# Patient Record
Sex: Male | Born: 1950 | Race: White | Hispanic: No | Smoking: Current every day smoker
Health system: Southern US, Community
[De-identification: ages and names within clinical notes are randomized; demographics above are authoritative.]

## PROBLEM LIST (undated history)

## (undated) DIAGNOSIS — F329 Major depressive disorder, single episode, unspecified: Secondary | ICD-10-CM

## (undated) DIAGNOSIS — F1010 Alcohol abuse, uncomplicated: Secondary | ICD-10-CM

## (undated) DIAGNOSIS — F419 Anxiety disorder, unspecified: Secondary | ICD-10-CM

## (undated) DIAGNOSIS — E785 Hyperlipidemia, unspecified: Secondary | ICD-10-CM

## (undated) DIAGNOSIS — T7840XA Allergy, unspecified, initial encounter: Secondary | ICD-10-CM

## (undated) DIAGNOSIS — F32A Depression, unspecified: Secondary | ICD-10-CM

## (undated) DIAGNOSIS — I10 Essential (primary) hypertension: Secondary | ICD-10-CM

## (undated) DIAGNOSIS — K219 Gastro-esophageal reflux disease without esophagitis: Secondary | ICD-10-CM

## (undated) HISTORY — DX: Hyperlipidemia, unspecified: E78.5

## (undated) HISTORY — DX: Alcohol abuse, uncomplicated: F10.10

## (undated) HISTORY — DX: Depression, unspecified: F32.A

## (undated) HISTORY — DX: Gastro-esophageal reflux disease without esophagitis: K21.9

## (undated) HISTORY — DX: Allergy, unspecified, initial encounter: T78.40XA

## (undated) HISTORY — DX: Essential (primary) hypertension: I10

## (undated) HISTORY — DX: Anxiety disorder, unspecified: F41.9

## (undated) HISTORY — DX: Major depressive disorder, single episode, unspecified: F32.9

---

## 2001-06-12 ENCOUNTER — Emergency Department (HOSPITAL_COMMUNITY): Admission: EM | Admit: 2001-06-12 | Discharge: 2001-06-12 | Payer: Self-pay | Admitting: Emergency Medicine

## 2008-06-05 ENCOUNTER — Inpatient Hospital Stay (HOSPITAL_COMMUNITY): Admission: AD | Admit: 2008-06-05 | Discharge: 2008-06-09 | Payer: Self-pay | Admitting: Orthopedic Surgery

## 2010-04-14 LAB — BASIC METABOLIC PANEL
BUN: 11 mg/dL (ref 6–23)
CO2: 23 mEq/L (ref 19–32)
Chloride: 106 mEq/L (ref 96–112)
Creatinine, Ser: 0.91 mg/dL (ref 0.4–1.5)
Glucose, Bld: 157 mg/dL — ABNORMAL HIGH (ref 70–99)
Potassium: 3.3 mEq/L — ABNORMAL LOW (ref 3.5–5.1)

## 2010-04-14 LAB — CBC
HCT: 47.5 % (ref 39.0–52.0)
MCHC: 34.4 g/dL (ref 30.0–36.0)
MCV: 98.3 fL (ref 78.0–100.0)
Platelets: 212 10*3/uL (ref 150–400)
RDW: 12.9 % (ref 11.5–15.5)

## 2010-04-14 LAB — SEDIMENTATION RATE: Sed Rate: 18 mm/hr — ABNORMAL HIGH (ref 0–16)

## 2010-05-20 NOTE — Discharge Summary (Signed)
NAMEJULUS, KELLEY NO.:  [id number]   MEDICAL RECORD NO.:  [id number]          PATIENT TYPE:  INP   LOCATION:  5004                         FACILITY:  [facility]   PHYSICIAN:  Eulas Post, MD    DATE OF BIRTH:  1950/05/13   DATE OF ADMISSION:  06/05/2008  DATE OF DISCHARGE:  06/09/2008                               DISCHARGE SUMMARY   ADMISSION DIAGNOSIS:  Right foot cellulitis.   DISCHARGE DIAGNOSIS:  Right foot cellulitis.   DISCHARGE MEDICATIONS:  Ciprofloxacin and Keflex for a period of 2  weeks, as well as Vicodin.   HOSPITAL COURSE:  Mr. Da Authement is a 60 year old gentleman who stepped  on a nail.  He was on outpatient antibiotics in the form of doxycycline  and amoxicillin.  He was having worsening redness and swelling in foot.  He presented to our office and was admitted to the hospital for IV  antibiotics.  He was placed on vancomycin and Ciprofloxacin IV.  He made  slow and steady progress each day.  There was never a location on the  foot that appeared to have any fluid collection or pus, and given his  marked improvement over the first 24-36 hours, we continued to monitor  him.  His swelling was improving and the redness has dramatically  improved on the dorsum of his foot.  He remained afebrile throughout his  hospital stay.  His sed rate was only 18 and his white count was 13.  He  is planned to be discharged home with followup with me next Wednesday.  He benefited maximally from this hospital stay and there were no  complications.      Eulas Post, MD  Electronically Signed     JPL/MEDQ  D:  06/08/2008  T:  06/08/2008  Job:  [id number]

## 2010-05-23 NOTE — Consult Note (Signed)
[place]. [facility]  Patient:    Bruce Lara, Bruce Lara Visit Number: [id number] MRN: [id number]          Service Type: EMS Location: [facility] Attending Physician:  Hanley Seamen Dictated by:   Artist Pais Mina Marble, M.D. Proc. Date: 06/12/01 Admit Date:  06/12/2001 Discharge Date: 06/12/2001                            Consultation Report  PHYSICIAN REQUESTING CONSULTATION:  _____  REASON FOR CONSULTATION:  This is a 60 year old man who was sharpening a blade and sustained a laceration to his right thumb and hand over the base of the thenar musculature.  He was transferred here from Dr. Jule Ser office at [facility] with questionable arterial bleed.  He comes in today.  He is an otherwise healthy 60 year old male with no known drug allergies and no current medications.  RECENT HOSPITALIZATIONS/SURGERIES:  Noncontributory.  FAMILY HISTORY:  Noncontributory.  SOCIAL HISTORY:  Noncontributory.  PHYSICAL EXAMINATION:  He has a 5 cm laceration at the base of the thumb in the area of the thenar musculature.  He has a palpable radial pulse proximally and he is neurovascularly intact distally.  He has full thumb motion including flexion and extension, abduction and adduction as well as opposition.  He does have a laceration through the skin and dermis with exposed thenar muscle.  No evidence of true arterial bleeding in the hand today in the emergency department.  DISPOSITION:  He was given a 1% plain lidocaine field block.  Once adequate anesthesia was obtained, he was prepped and draped in the usual sterile field. The wound was closed with 4-0 nylon and horizontal mattress sutures x 5, placed in a sterile dressing with Xeroform and 4 x 4s and a compressive wrap. He was discharged from the emergency department on Vicodin #15 for pain, Keflex 500 mg one p.o. q.i.d. for one week for antibiotic prophylaxis.  Follow up in my office this Thursday. Dictated by:    Artist Pais Mina Marble, M.D. Attending Physician:  Hanley Seamen DD:  06/12/01 TD:  06/14/01 Job: [id number]

## 2014-06-11 ENCOUNTER — Ambulatory Visit (INDEPENDENT_AMBULATORY_CARE_PROVIDER_SITE_OTHER): Payer: 59 | Admitting: Internal Medicine

## 2014-06-11 ENCOUNTER — Encounter (INDEPENDENT_AMBULATORY_CARE_PROVIDER_SITE_OTHER): Payer: Self-pay

## 2014-06-11 ENCOUNTER — Encounter: Payer: Self-pay | Admitting: Internal Medicine

## 2014-06-11 VITALS — BP 130/82 | HR 85 | Temp 98.7°F | Wt 166.0 lb

## 2014-06-11 DIAGNOSIS — F329 Major depressive disorder, single episode, unspecified: Secondary | ICD-10-CM | POA: Diagnosis not present

## 2014-06-11 DIAGNOSIS — F32A Depression, unspecified: Secondary | ICD-10-CM | POA: Insufficient documentation

## 2014-06-11 DIAGNOSIS — F1010 Alcohol abuse, uncomplicated: Secondary | ICD-10-CM | POA: Insufficient documentation

## 2014-06-11 DIAGNOSIS — I10 Essential (primary) hypertension: Secondary | ICD-10-CM | POA: Diagnosis not present

## 2014-06-11 MED ORDER — AMLODIPINE BESYLATE 5 MG PO TABS: 5.0000 mg | ORAL_TABLET | Freq: Every day | ORAL | Status: DC

## 2014-06-11 NOTE — Progress Notes (Signed)
Pre visit review using our clinic review tool, if applicable. No additional management support is needed unless otherwise documented below in the visit note. 

## 2014-06-11 NOTE — Assessment & Plan Note (Signed)
Well controlled on current therapy  Will check CMET at next visit Will request records from previous PCP

## 2014-06-11 NOTE — Assessment & Plan Note (Signed)
Stable on Prozac and Wellbutrin.

## 2014-06-11 NOTE — Progress Notes (Addendum)
HPI  Pt presents to the clinic today to establish care an for management of the conditions listed below. He is transferring care from [facility] in [facility]. Dr. Jeralene Huff was his previous PCP.  HTN: BP well controlled on Clorthalidone and Norvasc. His Lisinopril was stopped due to elevated creatinine. His creatinine was last checked 5/17 and it 1.7.  Depression: Chronic but well controlled on Wellbutrin and Prozac. He denies SI/HI.  Alcohol abuse: He reports he only drinks about 3 beers per night. He does not feel like he has a problem.  Past Medical History  Diagnosis Date  . Depression   . Hypertension   . Alcohol abuse     Current Outpatient Prescriptions  Medication Sig Dispense Refill  . amLODipine (NORVASC) 5 MG tablet Take 5 mg by mouth daily.     Marland Kitchen aspirin 325 MG tablet Take 325 mg by mouth daily.    Marland Kitchen buPROPion (WELLBUTRIN XL) 300 MG 24 hr tablet Take 300 mg by mouth daily.     . chlorthalidone (HYGROTON) 25 MG tablet Take 25 mg by mouth daily.     Marland Kitchen FLUoxetine (PROZAC) 20 MG capsule Take 20 mg by mouth daily.      No current facility-administered medications for this visit.    No Known Allergies  Family History  Problem Relation Age of Onset  . Alcohol abuse Father   . Hypertension Father     History   Social History  . Marital Status: Married    Spouse Name: N/A  . Number of Children: N/A  . Years of Education: N/A   Occupational History  . Not on file.   Social History Main Topics  . Smoking status: Former Research scientist (life sciences)  . Smokeless tobacco: Not on file     Comment: quit 2015 smoke 30 years  . Alcohol Use: 0.0 oz/week    0 Standard drinks or equivalent per week     Comment: daily---40 oz daily  . Drug Use: Not on file  . Sexual Activity: Not on file   Other Topics Concern  . Not on file   Social History Narrative  . No narrative on file    ROS:  Constitutional: Denies fever, malaise, fatigue, headache or abrupt weight changes.  HEENT: Denies  eye pain, eye redness, ear pain, ringing in the ears, wax buildup, runny nose, nasal congestion, bloody nose, or sore throat. Respiratory: Denies difficulty breathing, shortness of breath, cough or sputum production.   Cardiovascular: Denies chest pain, chest tightness, palpitations or swelling in the hands or feet.  Gastrointestinal: Denies abdominal pain, bloating, constipation, diarrhea or blood in the stool.  GU: Denies frequency, urgency, pain with urination, blood in urine, odor or discharge. Musculoskeletal: Denies decrease in range of motion, difficulty with gait, muscle pain or joint pain and swelling.  Skin: Denies redness, rashes, lesions or ulcercations.  Neurological: Denies dizziness, difficulty with memory, difficulty with speech or problems with balance and coordination.  Psych: Pt reports chronic depression. Denies anxiety, SI/HI.  No other specific complaints in a complete review of systems (except as listed in HPI above).  PE:  BP 130/82 mmHg  Pulse 85  Temp(Src) 98.7 F (37.1 C) (Oral)  Wt 166 lb (75.297 kg)  SpO2 98% Wt Readings from Last 3 Encounters:  06/11/14 166 lb (75.297 kg)    General: Appears his stated age, well developed, well nourished in NAD. HEENT: Head: normal shape and size; Eyes: sclera white, no icterus, conjunctiva pink, PERRLA and EOMs intact;  Cardiovascular: Normal rate and rhythm. S1,S2 noted.  No murmur, rubs or gallops noted.  Pulmonary/Chest: Normal effort and positive vesicular breath sounds. No respiratory distress. No wheezes, rales or ronchi noted.  Neurological: Alert and oriented.  Psychiatric: Mood and affect normal. Behavior is normal. Judgment and thought content normal.    BMET    Component Value Date/Time   NA 136 06/05/2008 1910   K 3.3* 06/05/2008 1910   CL 106 06/05/2008 1910   CO2 23 06/05/2008 1910   GLUCOSE 157* 06/05/2008 1910   BUN 11 06/05/2008 1910   CREATININE 0.91 06/05/2008 1910   CALCIUM 8.7 06/05/2008  1910   GFRNONAA >60 06/05/2008 1910   GFRAA  06/05/2008 1910    >60        The eGFR has been calculated using the MDRD equation. This calculation has not been validated in all clinical situations. eGFR's persistently <60 mL/min signify possible Chronic Kidney Disease.    Lipid Panel  No results found for: CHOL, TRIG, HDL, CHOLHDL, VLDL, LDLCALC  CBC    Component Value Date/Time   WBC 13.3* 06/05/2008 1910   RBC 4.83 06/05/2008 1910   HGB 16.4 06/05/2008 1910   HCT 47.5 06/05/2008 1910   PLT 212 06/05/2008 1910   MCV 98.3 06/05/2008 1910   MCHC 34.4 06/05/2008 1910   RDW 12.9 06/05/2008 1910    Hgb A1C No results found for: HGBA1C   Assessment and Plan:

## 2014-06-11 NOTE — Assessment & Plan Note (Signed)
He does not feel like he has a problem Support offered today

## 2014-06-11 NOTE — Patient Instructions (Signed)

## 2014-12-11 ENCOUNTER — Encounter: Payer: Self-pay | Admitting: Internal Medicine

## 2014-12-11 ENCOUNTER — Ambulatory Visit (INDEPENDENT_AMBULATORY_CARE_PROVIDER_SITE_OTHER): Payer: 59 | Admitting: Internal Medicine

## 2014-12-11 ENCOUNTER — Other Ambulatory Visit: Payer: Self-pay | Admitting: Internal Medicine

## 2014-12-11 VITALS — BP 126/84 | HR 77 | Temp 98.6°F | Ht 65.75 in | Wt 168.0 lb

## 2014-12-11 DIAGNOSIS — I10 Essential (primary) hypertension: Secondary | ICD-10-CM

## 2014-12-11 DIAGNOSIS — Z23 Encounter for immunization: Secondary | ICD-10-CM

## 2014-12-11 DIAGNOSIS — Z125 Encounter for screening for malignant neoplasm of prostate: Secondary | ICD-10-CM | POA: Diagnosis not present

## 2014-12-11 DIAGNOSIS — F329 Major depressive disorder, single episode, unspecified: Secondary | ICD-10-CM | POA: Diagnosis not present

## 2014-12-11 DIAGNOSIS — F1010 Alcohol abuse, uncomplicated: Secondary | ICD-10-CM | POA: Diagnosis not present

## 2014-12-11 DIAGNOSIS — Z0000 Encounter for general adult medical examination without abnormal findings: Secondary | ICD-10-CM | POA: Diagnosis not present

## 2014-12-11 DIAGNOSIS — Z1211 Encounter for screening for malignant neoplasm of colon: Secondary | ICD-10-CM | POA: Diagnosis not present

## 2014-12-11 DIAGNOSIS — E785 Hyperlipidemia, unspecified: Secondary | ICD-10-CM

## 2014-12-11 DIAGNOSIS — F32A Depression, unspecified: Secondary | ICD-10-CM

## 2014-12-11 DIAGNOSIS — E876 Hypokalemia: Secondary | ICD-10-CM

## 2014-12-11 LAB — COMPREHENSIVE METABOLIC PANEL
ALBUMIN: 4.2 g/dL (ref 3.5–5.2)
ALT: 17 U/L (ref 0–53)
AST: 21 U/L (ref 0–37)
Alkaline Phosphatase: 75 U/L (ref 39–117)
BILIRUBIN TOTAL: 0.7 mg/dL (ref 0.2–1.2)
BUN: 12 mg/dL (ref 6–23)
CALCIUM: 9.4 mg/dL (ref 8.4–10.5)
CHLORIDE: 94 meq/L — AB (ref 96–112)
CO2: 31 meq/L (ref 19–32)
CREATININE: 1.16 mg/dL (ref 0.40–1.50)
GFR: 67.69 mL/min (ref >60.00)
Glucose, Bld: 97 mg/dL (ref 70–99)
Potassium: 3.2 mEq/L — ABNORMAL LOW (ref 3.5–5.1)
Sodium: 134 mEq/L — ABNORMAL LOW (ref 135–145)
Total Protein: 7.3 g/dL (ref 6.0–8.3)

## 2014-12-11 LAB — LIPID PANEL
CHOL/HDL RATIO: 6
CHOLESTEROL: 246 mg/dL — AB (ref 0–200)
HDL: 43.3 mg/dL (ref >39.00)
NONHDL: 203.02
TRIGLYCERIDES: 296 mg/dL — AB (ref 0.0–149.0)
VLDL: 59.2 mg/dL — ABNORMAL HIGH (ref 0.0–40.0)

## 2014-12-11 LAB — CBC
HCT: 50.3 % (ref 39.0–52.0)
HEMOGLOBIN: 17 g/dL (ref 13.0–17.0)
MCHC: 33.7 g/dL (ref 30.0–36.0)
MCV: 96.7 fl (ref 78.0–100.0)
PLATELETS: 326 10*3/uL (ref 150.0–400.0)
RBC: 5.2 Mil/uL (ref 4.22–5.81)
RDW: 12.9 % (ref 11.5–15.5)
WBC: 11.2 10*3/uL — ABNORMAL HIGH (ref 4.0–10.5)

## 2014-12-11 LAB — LDL CHOLESTEROL, DIRECT: Direct LDL: 155 mg/dL

## 2014-12-11 LAB — PSA: PSA: 0.45 ng/mL (ref 0.10–4.00)

## 2014-12-11 NOTE — Assessment & Plan Note (Addendum)
Well controlled on Norvasc and Chlorthalidone CMET today

## 2014-12-11 NOTE — Assessment & Plan Note (Signed)
Support offered He still does not feel like he has an issue

## 2014-12-11 NOTE — Progress Notes (Signed)
Subjective:    Patient ID: Bruce Lara, male    DOB: Apr 13, 1950, 64 y.o.   MRN: [id number]  HPI  Pt presents to the clinic today for his annual exam. He is also due for follow up of chronic conditions, see separate note.  Flu: 10/2013 Tetanus: 2015 Pneumovax: 2015 Zostovax: never, did have chicken pox Colon Screening: never Vision Screening: as needed Dentist: as needed  Diet: He does consume meat. He eats more veggies than fruits. He does consume some fried foods. He drinks mostly soda and beer. Exercise: He is not exercising.  Review of Systems      Past Medical History  Diagnosis Date  . Depression   . Hypertension   . Alcohol abuse     Current Outpatient Prescriptions  Medication Sig Dispense Refill  . amLODipine (NORVASC) 5 MG tablet Take 1 tablet (5 mg total) by mouth daily. 30 tablet 5  . aspirin 325 MG tablet Take 325 mg by mouth daily.    Marland Kitchen buPROPion (WELLBUTRIN XL) 300 MG 24 hr tablet Take 300 mg by mouth daily.     . chlorthalidone (HYGROTON) 25 MG tablet Take 25 mg by mouth daily.     Marland Kitchen FLUoxetine (PROZAC) 20 MG capsule Take 20 mg by mouth daily.      No current facility-administered medications for this visit.    No Known Allergies  Family History  Problem Relation Age of Onset  . Alcohol abuse Father   . Hypertension Father     Social History   Social History  . Marital Status: Married    Spouse Name: N/A  . Number of Children: N/A  . Years of Education: N/A   Occupational History  . Not on file.   Social History Main Topics  . Smoking status: Former Research scientist (life sciences)  . Smokeless tobacco: Not on file     Comment: quit 2015 smoke 30 years  . Alcohol Use: 1.8 oz/week    0 Standard drinks or equivalent, 3 Cans of beer per week     Comment: daily---40 oz daily  . Drug Use: No  . Sexual Activity: Not Currently   Other Topics Concern  . Not on file   Social History Narrative     Constitutional: Denies fever, malaise, fatigue, headache or  abrupt weight changes.  HEENT: Pt reports decreased hearing in right ear. Denies eye pain, eye redness, ear pain, ringing in the ears, wax buildup, runny nose, nasal congestion, bloody nose, or sore throat. Respiratory: Denies difficulty breathing, shortness of breath, cough or sputum production.   Cardiovascular: Denies chest pain, chest tightness, palpitations or swelling in the hands or feet.  Gastrointestinal: Denies abdominal pain, bloating, constipation, diarrhea or blood in the stool.  GU: Denies urgency, frequency, pain with urination, burning sensation, blood in urine, odor or discharge. Musculoskeletal: Denies decrease in range of motion, difficulty with gait, muscle pain or joint pain and swelling.  Skin: Pt reports scaly lesions on arms. Denies redness or ulcercations.  Neurological: Denies dizziness, difficulty with memory, difficulty with speech or problems with balance and coordination.  Psych: Pt reports chronic depression. Denies anxiety, SI/HI.  No other specific complaints in a complete review of systems (except as listed in HPI above).  Objective:   Physical Exam  BP 126/84 mmHg  Pulse 77  Temp(Src) 98.6 F (37 C) (Oral)  Ht 5' 5.75" (1.67 m)  Wt 168 lb (76.204 kg)  BMI 27.32 kg/m2  SpO2 98% Wt Readings from Last 3 Encounters:  12/11/14 168 lb (76.204 kg)  06/11/14 166 lb (75.297 kg)    General: Appears his stated age, well developed, well nourished in NAD. Skin: Warm, dry and intact. Scaly plaques noted on bilateral forearms. HEENT: Head: normal shape and size; Eyes: sclera injected, no icterus, conjunctiva pink, PERRLA and EOMs intact; Right Ears: Tm's gray and intact, normal light reflex; Left Ear: cerumen impaction.  Throat/Mouth: Teeth present, mucosa pink and moist, no exudate, lesions or ulcerations noted.  Neck:  Neck supple, trachea midline. No masses, lumps or thyromegaly present.  Cardiovascular: Normal rate and rhythm. S1,S2 noted.  No murmur, rubs or  gallops noted. No JVD or BLE edema. No carotid bruits noted. Pulmonary/Chest: Normal effort and positive vesicular breath sounds. No respiratory distress. No wheezes, rales or ronchi noted.  Abdomen: Soft and nontender. Normal bowel sounds. No distention or masses noted. Liver, spleen and kidneys non palpable. Musculoskeletal: Strength 5/5 BUE/BLE. No signs of joint swelling. No difficulty with gait.  Neurological: Alert and oriented. Cranial nerves II-XII grossly intact. Coordination normal.  Psychiatric: Mood and affect normal. Behavior is normal. Judgment and thought content normal.     BMET    Component Value Date/Time   NA 136 06/05/2008 1910   K 3.3* 06/05/2008 1910   CL 106 06/05/2008 1910   CO2 23 06/05/2008 1910   GLUCOSE 157* 06/05/2008 1910   BUN 11 06/05/2008 1910   CREATININE 0.91 06/05/2008 1910   CALCIUM 8.7 06/05/2008 1910   GFRNONAA >60 06/05/2008 1910   GFRAA  06/05/2008 1910    >60        The eGFR has been calculated using the MDRD equation. This calculation has not been validated in all clinical situations. eGFR's persistently <60 mL/min signify possible Chronic Kidney Disease.    Lipid Panel  No results found for: CHOL, TRIG, HDL, CHOLHDL, VLDL, LDLCALC  CBC    Component Value Date/Time   WBC 13.3* 06/05/2008 1910   RBC 4.83 06/05/2008 1910   HGB 16.4 06/05/2008 1910   HCT 47.5 06/05/2008 1910   PLT 212 06/05/2008 1910   MCV 98.3 06/05/2008 1910   MCHC 34.4 06/05/2008 1910   RDW 12.9 06/05/2008 1910    Hgb A1C No results found for: HGBA1C       Assessment & Plan:   Preventative Health Maintenance:  Flu shot today Tetanus and Pneumovax UTD He will call insurance company about shingles vaccine Encouraged him to consume a balance diet and start an exercise regimen He declines colonoscopy but is agreeable to IFOB, ordered today Encouraged him to see an eye doctor and dentist at least annually CBC, CMET, Lipid, PSA, HIV and Hep C  today  RC in 6 months, sooner if needed  HPI:  HTN: BP well controlled on Clorthalidone and Norvasc. His Lisinopril was stopped due to elevated creatinine. His creatinine was last checked 6/16 and it 0.9.  Depression: Chronic but well controlled on Wellbutrin and Prozac. He denies SI/HI.  Alcohol abuse: He reports he only drinks about 3 beers per night. He does not feel like he has a problem.  Review of Systems:   Past Medical History  Diagnosis Date  . Depression   . Hypertension   . Alcohol abuse     Current Outpatient Prescriptions  Medication Sig Dispense Refill  . amLODipine (NORVASC) 5 MG tablet Take 1 tablet (5 mg total) by mouth daily. 30 tablet 5  . aspirin 325 MG tablet Take 325 mg by mouth daily.    Marland Kitchen  buPROPion (WELLBUTRIN XL) 300 MG 24 hr tablet Take 300 mg by mouth daily.     . chlorthalidone (HYGROTON) 25 MG tablet Take 25 mg by mouth daily.     Marland Kitchen FLUoxetine (PROZAC) 20 MG capsule Take 20 mg by mouth daily.      No current facility-administered medications for this visit.    No Known Allergies  Family History  Problem Relation Age of Onset  . Alcohol abuse Father   . Hypertension Father     Social History   Social History  . Marital Status: Married    Spouse Name: N/A  . Number of Children: N/A  . Years of Education: N/A   Occupational History  . Not on file.   Social History Main Topics  . Smoking status: Former Research scientist (life sciences)  . Smokeless tobacco: Not on file     Comment: quit 2015 smoke 30 years  . Alcohol Use: 1.8 oz/week    3 Cans of beer, 0 Standard drinks or equivalent per week     Comment: daily---40 oz daily  . Drug Use: No  . Sexual Activity: Not Currently   Other Topics Concern  . Not on file   Social History Narrative     Constitutional: Denies fever, malaise, fatigue, headache or abrupt weight changes.  HEENT: Pt reports decreased hearing in right ear. Denies eye pain, eye redness, ear pain, ringing in the ears, wax buildup,  runny nose, nasal congestion, bloody nose, or sore throat. Respiratory: Denies difficulty breathing, shortness of breath, cough or sputum production.   Cardiovascular: Denies chest pain, chest tightness, palpitations or swelling in the hands or feet.  Gastrointestinal: Denies abdominal pain, bloating, constipation, diarrhea or blood in the stool.  GU: Denies urgency, frequency, pain with urination, burning sensation, blood in urine, odor or discharge. Musculoskeletal: Denies decrease in range of motion, difficulty with gait, muscle pain or joint pain and swelling.  Skin: Pt reports scaly lesions on arms. Denies redness or ulcercations.  Neurological: Denies dizziness, difficulty with memory, difficulty with speech or problems with balance and coordination.  Psych: Pt reports chronic depression. Denies anxiety, SI/HI.  No other specific complaints in a complete review of systems (except as listed in HPI above).  Objective:  BP 126/84 mmHg  Pulse 77  Temp(Src) 98.6 F (37 C) (Oral)  Ht 5' 5.75" (1.67 m)  Wt 168 lb (76.204 kg)  BMI 27.32 kg/m2  SpO2 98% General: Appears his stated age, well developed, well nourished in NAD. Skin: Warm, dry and intact. Scaly plaques noted on bilateral forearms. HEENT: Head: normal shape and size; Eyes: sclera injected, no icterus, conjunctiva pink, PERRLA and EOMs intact; Right Ears: Tm's gray and intact, normal light reflex; Left Ear: cerumen impaction.  Throat/Mouth: Teeth present, mucosa pink and moist, no exudate, lesions or ulcerations noted.  Neck:  Neck supple, trachea midline. No masses, lumps or thyromegaly present.  Cardiovascular: Normal rate and rhythm. S1,S2 noted.  No murmur, rubs or gallops noted. No JVD or BLE edema. No carotid bruits noted. Pulmonary/Chest: Normal effort and positive vesicular breath sounds. No respiratory distress. No wheezes, rales or ronchi noted.  Abdomen: Soft and nontender. Normal bowel sounds. No distention or masses  noted. Liver, spleen and kidneys non palpable. Musculoskeletal: Strength 5/5 BUE/BLE. No signs of joint swelling. No difficulty with gait.  Neurological: Alert and oriented. Cranial nerves II-XII grossly intact. Coordination normal.  Psychiatric: Mood and affect normal. Behavior is normal. Judgment and thought content normal.   Assessment and Plan:  Psoriasis:  He is not interested in treatment at this time

## 2014-12-11 NOTE — Assessment & Plan Note (Signed)
Continue Prozac and Wellbutrin CMET today

## 2014-12-11 NOTE — Patient Instructions (Signed)

## 2014-12-11 NOTE — Progress Notes (Signed)
Pre visit review using our clinic review tool, if applicable. No additional management support is needed unless otherwise documented below in the visit note. 

## 2014-12-12 LAB — HIV ANTIBODY (ROUTINE TESTING W REFLEX): HIV: NONREACTIVE

## 2014-12-12 LAB — HEPATITIS C ANTIBODY: HCV Ab: NEGATIVE

## 2014-12-14 MED ORDER — POTASSIUM CHLORIDE ER 10 MEQ PO TBCR: 10.0000 meq | EXTENDED_RELEASE_TABLET | Freq: Two times a day (BID) | ORAL | Status: DC

## 2014-12-14 MED ORDER — SIMVASTATIN 10 MG PO TABS: 10.0000 mg | ORAL_TABLET | Freq: Every day | ORAL | Status: DC

## 2014-12-14 NOTE — Addendum Note (Signed)
Addended by: Lindalou Hose Y on: 12/14/2014 05:00 PM   Modules accepted: Orders

## 2014-12-26 ENCOUNTER — Other Ambulatory Visit: Payer: Self-pay | Admitting: Internal Medicine

## 2014-12-26 DIAGNOSIS — I10 Essential (primary) hypertension: Secondary | ICD-10-CM

## 2014-12-26 MED ORDER — FLUOXETINE HCL 20 MG PO CAPS: 20.0000 mg | ORAL_CAPSULE | Freq: Every day | ORAL | Status: DC

## 2014-12-26 MED ORDER — BUPROPION HCL ER (XL) 300 MG PO TB24: 300.0000 mg | ORAL_TABLET | Freq: Every day | ORAL | Status: DC

## 2014-12-26 MED ORDER — AMLODIPINE BESYLATE 5 MG PO TABS: 5.0000 mg | ORAL_TABLET | Freq: Every day | ORAL | Status: DC

## 2014-12-26 MED ORDER — CHLORTHALIDONE 25 MG PO TABS: 25.0000 mg | ORAL_TABLET | Freq: Every day | ORAL | Status: DC

## 2014-12-26 NOTE — Progress Notes (Signed)
Pt called and wants to ck on status of multiple refills to [facility] [address] [facility]; advised pt was sent to [facility] [address]. Pt wants transferred now he is at the store. Spoke with Denman George at [employer] [employer] and pt she will get transferred now. Pt voiced understanding.

## 2015-01-02 NOTE — Addendum Note (Signed)
Addended by: Lurlean Nanny on: 01/02/2015 12:11 PM   Modules accepted: Orders

## 2015-01-15 ENCOUNTER — Other Ambulatory Visit: Payer: 59

## 2015-02-19 ENCOUNTER — Other Ambulatory Visit (INDEPENDENT_AMBULATORY_CARE_PROVIDER_SITE_OTHER): Payer: BLUE CROSS/BLUE SHIELD

## 2015-02-19 DIAGNOSIS — E876 Hypokalemia: Secondary | ICD-10-CM | POA: Diagnosis not present

## 2015-02-19 LAB — BASIC METABOLIC PANEL
BUN: 18 mg/dL (ref 6–23)
CO2: 27 mEq/L (ref 19–32)
Calcium: 9.5 mg/dL (ref 8.4–10.5)
Chloride: 102 mEq/L (ref 96–112)
Creatinine, Ser: 1.19 mg/dL (ref 0.40–1.50)
GFR: 65.68 mL/min (ref >60.00)
GLUCOSE: 117 mg/dL — AB (ref 70–99)
POTASSIUM: 3.5 meq/L (ref 3.5–5.1)
SODIUM: 138 meq/L (ref 135–145)

## 2015-02-27 ENCOUNTER — Other Ambulatory Visit: Payer: Self-pay | Admitting: Internal Medicine

## 2015-02-27 ENCOUNTER — Other Ambulatory Visit: Payer: Self-pay | Admitting: *Deleted

## 2015-03-20 ENCOUNTER — Encounter: Payer: Self-pay | Admitting: Internal Medicine

## 2015-03-20 ENCOUNTER — Ambulatory Visit (INDEPENDENT_AMBULATORY_CARE_PROVIDER_SITE_OTHER): Payer: BLUE CROSS/BLUE SHIELD | Admitting: Internal Medicine

## 2015-03-20 VITALS — BP 124/84 | HR 85 | Temp 98.4°F | Wt 164.0 lb

## 2015-03-20 DIAGNOSIS — J309 Allergic rhinitis, unspecified: Secondary | ICD-10-CM | POA: Diagnosis not present

## 2015-03-20 NOTE — Progress Notes (Signed)
Pre visit review using our clinic review tool, if applicable. No additional management support is needed unless otherwise documented below in the visit note. 

## 2015-03-20 NOTE — Patient Instructions (Signed)
Allergic Rhinitis Allergic rhinitis is when the mucous membranes in the nose respond to allergens. Allergens are particles in the air that cause your body to have an allergic reaction. This causes you to release allergic antibodies. Through a chain of events, these eventually cause you to release histamine into the blood stream. Although meant to protect the body, it is this release of histamine that causes your discomfort, such as frequent sneezing, congestion, and an itchy, runny nose.  CAUSES Seasonal allergic rhinitis (hay fever) is caused by pollen allergens that may come from grasses, trees, and weeds. Year-round allergic rhinitis (perennial allergic rhinitis) is caused by allergens such as house dust mites, pet dander, and mold spores. SYMPTOMS  Nasal stuffiness (congestion).  Itchy, runny nose with sneezing and tearing of the eyes. DIAGNOSIS Your health care provider can help you determine the allergen or allergens that trigger your symptoms. If you and your health care provider are unable to determine the allergen, skin or blood testing may be used. Your health care provider will diagnose your condition after taking your health history and performing a physical exam. Your health care provider may assess you for other related conditions, such as asthma, pink eye, or an ear infection. TREATMENT Allergic rhinitis does not have a cure, but it can be controlled by:  Medicines that block allergy symptoms. These may include allergy shots, nasal sprays, and oral antihistamines.  Avoiding the allergen. Hay fever may often be treated with antihistamines in pill or nasal spray forms. Antihistamines block the effects of histamine. There are over-the-counter medicines that may help with nasal congestion and swelling around the eyes. Check with your health care provider before taking or giving this medicine. If avoiding the allergen or the medicine prescribed do not work, there are many new medicines  your health care provider can prescribe. Stronger medicine may be used if initial measures are ineffective. Desensitizing injections can be used if medicine and avoidance does not work. Desensitization is when a patient is given ongoing shots until the body becomes less sensitive to the allergen. Make sure you follow up with your health care provider if problems continue. HOME CARE INSTRUCTIONS It is not possible to completely avoid allergens, but you can reduce your symptoms by taking steps to limit your exposure to them. It helps to know exactly what you are allergic to so that you can avoid your specific triggers. SEEK MEDICAL CARE IF:  You have a fever.  You develop a cough that does not stop easily (persistent).  You have shortness of breath.  You start wheezing.  Symptoms interfere with normal daily activities.   This information is not intended to replace advice given to you by your health care provider. Make sure you discuss any questions you have with your health care provider.   Document Released: 09/16/2000 Document Revised: 01/12/2014 Document Reviewed: 08/29/2012 Elsevier Interactive Patient Education 2016 [employer]  

## 2015-03-20 NOTE — Progress Notes (Signed)
HPI   Pt presents to the clinic today with c/o runny nose, sore throat and cough. This started 4 days ago. He is blowing clear mucous out of his nose. The cough is nonproductive. He denies fever, chills, body aches or shortness of breath. He has taken Tylenol with minimal relief. He has no history of seasonal allergies. He has not had sick contacts.  Review of Systems    Past Medical History  Diagnosis Date  . Depression   . Hypertension   . Alcohol abuse     Family History  Problem Relation Age of Onset  . Alcohol abuse Father   . Hypertension Father     Social History   Social History  . Marital Status: Married    Spouse Name: N/A  . Number of Children: N/A  . Years of Education: N/A   Occupational History  . Not on file.   Social History Main Topics  . Smoking status: Former Research scientist (life sciences)  . Smokeless tobacco: Not on file     Comment: quit 2015 smoke 30 years  . Alcohol Use: 1.8 oz/week    3 Cans of beer, 0 Standard drinks or equivalent per week     Comment: daily---40 oz daily  . Drug Use: No  . Sexual Activity: Not Currently   Other Topics Concern  . Not on file   Social History Narrative    No Known Allergies   Constitutional: Denies headache, fatigue, fever or abrupt weight changes.  HEENT:  Positive runny nose and sore throat. Denies eye redness, ear pain, ringing in the ears, wax buildup, nasal congestion. Respiratory: Positive cough. Denies difficulty breathing or shortness of breath.  Cardiovascular: Denies chest pain, chest tightness, palpitations or swelling in the hands or feet.   No other specific complaints in a complete review of systems (except as listed in HPI above).  Objective:  BP 124/84 mmHg  Pulse 85  Temp(Src) 98.4 F (36.9 C) (Oral)  Wt 164 lb (74.39 kg)  SpO2 98%   General: Appears his stated age,well developed, well nourished in NAD. HEENT: Head: normal shape and size, no sinus tenderness noted; Eyes: sclera white, no icterus,  conjunctiva pink; Ears: Tm's gray and intact, normal light reflex; Nose: mucosa boggy and moist, septum midline; Throat/Mouth: + PND. Teeth present, mucosa erythematous and moist, no exudate noted, no lesions or ulcerations noted.  Neck:  No adenopathy noted.  Cardiovascular: Normal rate and rhythm. S1,S2 noted.  No murmur, rubs or gallops noted.  Pulmonary/Chest: Normal effort and positive vesicular breath sounds. No respiratory distress. No wheezes, rales or ronchi noted.      Assessment & Plan:   Acute bacterial sinusitis  Can use a Neti Pot which can be purchased from your local drug store. Flonase 2 sprays each nostril for 3 days and then as needed. Start Zyrtec daily OTC 80 mg Depo IM today  RTC as needed or if symptoms persist.

## 2015-04-01 ENCOUNTER — Other Ambulatory Visit: Payer: Self-pay | Admitting: Internal Medicine

## 2015-04-04 ENCOUNTER — Other Ambulatory Visit: Payer: Self-pay | Admitting: *Deleted

## 2015-04-04 DIAGNOSIS — E876 Hypokalemia: Secondary | ICD-10-CM

## 2015-04-04 NOTE — Telephone Encounter (Signed)
Prescribed potassium in February.  Not sure if he was to continue taking this or if repeat labs are needed.  Please advise.

## 2015-04-05 NOTE — Telephone Encounter (Signed)
Can you call pt to verify if he is taking this daily?

## 2015-04-09 MED ORDER — POTASSIUM CHLORIDE ER 10 MEQ PO TBCR: 10.0000 meq | EXTENDED_RELEASE_TABLET | Freq: Two times a day (BID) | ORAL | Status: DC

## 2015-04-09 NOTE — Telephone Encounter (Signed)
Medication sent electronically 

## 2015-04-09 NOTE — Telephone Encounter (Signed)
Patient called and said he's out of his medication.  He takes the Potassium twice a day.  Patient can be reached at  [phone].

## 2015-04-11 NOTE — Telephone Encounter (Signed)
Pt called to ck on refill simvastatin; spoke with Inez Catalina at [employer] and pt did not pick up rx and was put back on shelf; Inez Catalina will get rx ready for pick up; pt will ck with pharmacy later today.

## 2015-05-01 ENCOUNTER — Ambulatory Visit: Payer: BLUE CROSS/BLUE SHIELD | Admitting: Family Medicine

## 2015-05-02 ENCOUNTER — Ambulatory Visit (INDEPENDENT_AMBULATORY_CARE_PROVIDER_SITE_OTHER): Payer: BLUE CROSS/BLUE SHIELD | Admitting: Internal Medicine

## 2015-05-02 ENCOUNTER — Encounter: Payer: Self-pay | Admitting: Internal Medicine

## 2015-05-02 VITALS — BP 122/76 | HR 93 | Temp 98.6°F | Wt 162.0 lb

## 2015-05-02 DIAGNOSIS — J309 Allergic rhinitis, unspecified: Secondary | ICD-10-CM | POA: Diagnosis not present

## 2015-05-02 DIAGNOSIS — J329 Chronic sinusitis, unspecified: Secondary | ICD-10-CM | POA: Diagnosis not present

## 2015-05-02 DIAGNOSIS — B349 Viral infection, unspecified: Secondary | ICD-10-CM | POA: Diagnosis not present

## 2015-05-02 DIAGNOSIS — B9789 Other viral agents as the cause of diseases classified elsewhere: Secondary | ICD-10-CM

## 2015-05-02 MED ORDER — METHYLPREDNISOLONE ACETATE 80 MG/ML IJ SUSP
80.0000 mg | Freq: Once | INTRAMUSCULAR | Status: AC
  Administered 2015-05-02: 80 mg via INTRAMUSCULAR

## 2015-05-02 MED ORDER — HYDROCODONE-HOMATROPINE 5-1.5 MG/5ML PO SYRP: 5.0000 mL | ORAL_SOLUTION | Freq: Three times a day (TID) | ORAL | Status: DC | PRN

## 2015-05-02 NOTE — Addendum Note (Signed)
Addended by: Lurlean Nanny on: 05/02/2015 02:16 PM   Modules accepted: Orders

## 2015-05-02 NOTE — Progress Notes (Signed)
Pre visit review using our clinic review tool, if applicable. No additional management support is needed unless otherwise documented below in the visit note. 

## 2015-05-02 NOTE — Patient Instructions (Signed)

## 2015-05-02 NOTE — Progress Notes (Signed)
HPI  Pt presents to the clinic today with c/o headache, sinus pressure, nasal congestion, sore throat, and cough. This started 1 week ago.  He is not blowing anything out of his nose. His cough is now productive with unknown color mucous. The cough is worse at night. He denies fever, chills or body aches. He has taken Allegra and Nasonex with minimal relief. He has not had sick contacts.   Review of Systems    Past Medical History  Diagnosis Date  . Depression   . Hypertension   . Alcohol abuse     Family History  Problem Relation Age of Onset  . Alcohol abuse Father   . Hypertension Father     Social History   Social History  . Marital Status: Married    Spouse Name: N/A  . Number of Children: N/A  . Years of Education: N/A   Occupational History  . Not on file.   Social History Main Topics  . Smoking status: Former Research scientist (life sciences)  . Smokeless tobacco: Not on file     Comment: quit 2015 smoke 30 years  . Alcohol Use: 1.8 oz/week    3 Cans of beer, 0 Standard drinks or equivalent per week     Comment: daily---40 oz daily  . Drug Use: No  . Sexual Activity: Not Currently   Other Topics Concern  . Not on file   Social History Narrative    No Known Allergies   Constitutional: Positive headache. Denies fatigue, fever, and abrupt weight changes.  HEENT:  Positive eye pain, facial pain, nasal congestion and sore throat. Denies eye redness, ear pain, ringing in the ears, wax buildup, runny nose or bloody nose. Respiratory: Positive cough. Denies difficulty breathing or shortness of breath.  Cardiovascular: Denies chest pain, chest tightness, palpitations or swelling in the hands or feet.   No other specific complaints in a complete review of systems (except as listed in HPI above).  Objective:  BP 122/76 mmHg  Pulse 93  Temp(Src) 98.6 F (37 C) (Oral)  Wt 162 lb (73.483 kg)  SpO2 98%   General: Appears his stated age,  in NAD. HEENT: Head: normal shape and size, no  sinus tenderness noted; Eyes: sclera white, no icterus, conjunctiva pink; Ears: Tm's gray and intact, normal light reflex; Nose: mucosa boggy and moist, erythematous and inflamed, septum midline; Throat/Mouth: + PND, tonsilliths present. Teeth present, mucosa erythematous and moist, no exudate noted, no lesions or ulcerations noted.  Neck:  No adenopathy noted.  Cardiovascular: Normal rate and rhythm. S1,S2 noted.  No murmur, rubs or gallops noted.  Pulmonary/Chest: Normal effort and positive vesicular breath sounds. No respiratory distress. No wheezes, rales or ronchi noted.      Assessment & Plan:   Acute viral sinusitis/Allergic Rhinitis:  Continue Allegra and Nasonex RX for Hycodan cough syrup for nighttime cough 80 mg Depo IM today  RTC as needed or if symptoms persist.

## 2015-06-11 ENCOUNTER — Ambulatory Visit (INDEPENDENT_AMBULATORY_CARE_PROVIDER_SITE_OTHER): Payer: BLUE CROSS/BLUE SHIELD | Admitting: Internal Medicine

## 2015-06-11 ENCOUNTER — Encounter: Payer: Self-pay | Admitting: Internal Medicine

## 2015-06-11 VITALS — BP 120/84 | HR 84 | Temp 99.0°F | Wt 164.0 lb

## 2015-06-11 DIAGNOSIS — E785 Hyperlipidemia, unspecified: Secondary | ICD-10-CM | POA: Diagnosis not present

## 2015-06-11 DIAGNOSIS — F329 Major depressive disorder, single episode, unspecified: Secondary | ICD-10-CM

## 2015-06-11 DIAGNOSIS — F32A Depression, unspecified: Secondary | ICD-10-CM

## 2015-06-11 DIAGNOSIS — I10 Essential (primary) hypertension: Secondary | ICD-10-CM | POA: Diagnosis not present

## 2015-06-11 DIAGNOSIS — F1010 Alcohol abuse, uncomplicated: Secondary | ICD-10-CM

## 2015-06-11 LAB — LIPID PANEL
CHOLESTEROL: 226 mg/dL — AB (ref 0–200)
HDL: 57.3 mg/dL (ref >39.00)
Total CHOL/HDL Ratio: 4
Triglycerides: 447 mg/dL — ABNORMAL HIGH (ref 0.0–149.0)

## 2015-06-11 LAB — COMPREHENSIVE METABOLIC PANEL
ALBUMIN: 4.1 g/dL (ref 3.5–5.2)
ALK PHOS: 68 U/L (ref 39–117)
ALT: 19 U/L (ref 0–53)
AST: 21 U/L (ref 0–37)
BILIRUBIN TOTAL: 0.4 mg/dL (ref 0.2–1.2)
BUN: 11 mg/dL (ref 6–23)
CO2: 29 mEq/L (ref 19–32)
Calcium: 9.4 mg/dL (ref 8.4–10.5)
Chloride: 101 mEq/L (ref 96–112)
Creatinine, Ser: 1.16 mg/dL (ref 0.40–1.50)
GFR: 67.58 mL/min (ref >60.00)
GLUCOSE: 86 mg/dL (ref 70–99)
Potassium: 3.3 mEq/L — ABNORMAL LOW (ref 3.5–5.1)
SODIUM: 140 meq/L (ref 135–145)
TOTAL PROTEIN: 7.1 g/dL (ref 6.0–8.3)

## 2015-06-11 LAB — CBC
HCT: 42.3 % (ref 39.0–52.0)
HEMOGLOBIN: 14.6 g/dL (ref 13.0–17.0)
MCHC: 34.5 g/dL (ref 30.0–36.0)
MCV: 95.9 fl (ref 78.0–100.0)
Platelets: 396 10*3/uL (ref 150.0–400.0)
RBC: 4.41 Mil/uL (ref 4.22–5.81)
RDW: 13.3 % (ref 11.5–15.5)
WBC: 14.6 10*3/uL — AB (ref 4.0–10.5)

## 2015-06-11 LAB — LDL CHOLESTEROL, DIRECT: Direct LDL: 122 mg/dL

## 2015-06-11 NOTE — Progress Notes (Signed)
HPI:  Pt presents to the clinic today for follow up of chronic conditions.  HTN: BP well controlled on Clorthalidone and Norvasc. His Lisinopril was stopped due to elevated creatinine. His creatinine was last checked 02/2015 and it 1.19. His blood pressure is 120/84 today. There is no ECG on file.  Depression: Chronic but well controlled on Wellbutrin and Prozac. He denies SI/HI.  Alcohol abuse: He reports he only drinks about 3 beers per night. He does not feel like he has a problem.  HLD: His last LDL was 155. He was started on Zocor at his last visit. He denies myalgias. He does try to consume a low fat diet. He is taking a baby ASA daily.  Review of Systems:   Past Medical History  Diagnosis Date  . Depression   . Hypertension   . Alcohol abuse     Current Outpatient Prescriptions  Medication Sig Dispense Refill  . amLODipine (NORVASC) 5 MG tablet Take 1 tablet (5 mg total) by mouth daily. 30 tablet 5  . aspirin 325 MG tablet Take 325 mg by mouth daily.    Marland Kitchen buPROPion (WELLBUTRIN XL) 300 MG 24 hr tablet Take 300 mg by mouth daily.     . chlorthalidone (HYGROTON) 25 MG tablet Take 25 mg by mouth daily.     Marland Kitchen FLUoxetine (PROZAC) 20 MG capsule Take 20 mg by mouth daily.      No current facility-administered medications for this visit.    No Known Allergies  Family History  Problem Relation Age of Onset  . Alcohol abuse Father   . Hypertension Father     Social History   Social History  . Marital Status: Married    Spouse Name: N/A  . Number of Children: N/A  . Years of Education: N/A   Occupational History  . Not on file.   Social History Main Topics  . Smoking status: Former Research scientist (life sciences)  . Smokeless tobacco: Not on file     Comment: quit 2015 smoke 30 years  . Alcohol Use: 1.8 oz/week    3 Cans of beer, 0 Standard drinks or equivalent per week     Comment: daily---40 oz daily  . Drug Use: No  . Sexual Activity: Not Currently   Other Topics Concern  . Not on  file   Social History Narrative     Constitutional: Denies fever, malaise, fatigue, headache or abrupt weight changes.  Respiratory: Denies difficulty breathing, shortness of breath, cough or sputum production.   Cardiovascular: Denies chest pain, chest tightness, palpitations or swelling in the hands or feet.  Gastrointestinal: Denies abdominal pain, bloating, constipation, diarrhea or blood in the stool.  Musculoskeletal: Denies decrease in range of motion, difficulty with gait, muscle pain or joint pain and swelling.  Skin: Pt reports scaly lesions on arms. Denies redness or ulcercations.  Neurological: Denies dizziness, difficulty with memory, difficulty with speech or problems with balance and coordination.  Psych: Pt reports chronic depression. Denies anxiety, SI/HI.  No other specific complaints in a complete review of systems (except as listed in HPI above).  Objective:  BP 120/84 mmHg  Pulse 84  Temp(Src) 99 F (37.2 C) (Oral)  Wt 164 lb (74.39 kg)  SpO2 98%  General: Appears his stated age, in NAD. Cardiovascular: Normal rate and rhythm. S1,S2 noted.  No murmur, rubs or gallops noted. No JVD or BLE edema. No carotid bruits noted. Pulmonary/Chest: Normal effort and positive vesicular breath sounds. No respiratory distress. No wheezes, rales or  ronchi noted.  Neurological: Alert and oriented.  Psychiatric: Mood and affect flat Behavior is normal. Judgment and thought content normal.   Assessment and Plan:

## 2015-06-11 NOTE — Assessment & Plan Note (Signed)
Will check Lipid Profile and CMET today Will adjust Zocor if needed Continue ASA

## 2015-06-11 NOTE — Addendum Note (Signed)
Addended by: Marchia Bond on: 06/11/2015 04:25 PM   Modules accepted: Miquel Dunn

## 2015-06-11 NOTE — Progress Notes (Signed)
Pre visit review using our clinic review tool, if applicable. No additional management support is needed unless otherwise documented below in the visit note. 

## 2015-06-11 NOTE — Assessment & Plan Note (Signed)
Chronic but stable on Prozac and Wellbutrin He thinks the Prozac is causing erectile dysfunction but he does not want to stop medication

## 2015-06-11 NOTE — Patient Instructions (Signed)

## 2015-06-11 NOTE — Assessment & Plan Note (Addendum)
Will check CBC and CMET today Continue Norvasc and Chlorthalidone ECG today normal

## 2015-06-11 NOTE — Assessment & Plan Note (Signed)
He denies having an issue with this Will check CMET today

## 2015-06-12 MED ORDER — SIMVASTATIN 20 MG PO TABS: 20.0000 mg | ORAL_TABLET | Freq: Every day | ORAL | Status: DC

## 2015-06-12 NOTE — Addendum Note (Signed)
Addended by: Lurlean Nanny on: 06/12/2015 04:28 PM   Modules accepted: Orders, Medications, SmartSet

## 2015-07-18 ENCOUNTER — Other Ambulatory Visit: Payer: Self-pay

## 2015-07-18 DIAGNOSIS — E876 Hypokalemia: Secondary | ICD-10-CM

## 2015-07-18 MED ORDER — AMLODIPINE BESYLATE 5 MG PO TABS: 5.0000 mg | ORAL_TABLET | Freq: Every day | ORAL | Status: DC

## 2015-07-18 MED ORDER — BUPROPION HCL ER (XL) 300 MG PO TB24: 300.0000 mg | ORAL_TABLET | Freq: Every day | ORAL | Status: DC

## 2015-07-18 MED ORDER — FLUOXETINE HCL 20 MG PO CAPS: 20.0000 mg | ORAL_CAPSULE | Freq: Every day | ORAL | Status: DC

## 2015-07-18 MED ORDER — CHLORTHALIDONE 25 MG PO TABS: 25.0000 mg | ORAL_TABLET | Freq: Every day | ORAL | Status: DC

## 2015-07-18 MED ORDER — POTASSIUM CHLORIDE ER 10 MEQ PO TBCR
10.0000 meq | EXTENDED_RELEASE_TABLET | Freq: Two times a day (BID) | ORAL | Status: DC
Start: 2015-07-18 — End: 2016-09-17

## 2015-12-12 ENCOUNTER — Encounter: Payer: Self-pay | Admitting: Internal Medicine

## 2015-12-12 ENCOUNTER — Ambulatory Visit (INDEPENDENT_AMBULATORY_CARE_PROVIDER_SITE_OTHER): Payer: BLUE CROSS/BLUE SHIELD | Admitting: Internal Medicine

## 2015-12-12 VITALS — BP 130/84 | HR 89 | Temp 98.9°F | Ht 66.0 in | Wt 161.5 lb

## 2015-12-12 DIAGNOSIS — Z0001 Encounter for general adult medical examination with abnormal findings: Secondary | ICD-10-CM

## 2015-12-12 DIAGNOSIS — Z23 Encounter for immunization: Secondary | ICD-10-CM | POA: Diagnosis not present

## 2015-12-12 DIAGNOSIS — H6122 Impacted cerumen, left ear: Secondary | ICD-10-CM

## 2015-12-12 DIAGNOSIS — I10 Essential (primary) hypertension: Secondary | ICD-10-CM | POA: Diagnosis not present

## 2015-12-12 MED ORDER — AMLODIPINE BESYLATE 10 MG PO TABS: 10.0000 mg | ORAL_TABLET | Freq: Every day | ORAL | 3 refills | Status: DC

## 2015-12-12 NOTE — Patient Instructions (Signed)

## 2015-12-12 NOTE — Progress Notes (Signed)
Subjective:    Patient ID: Bruce Lara, male    DOB: 1950-10-27, 65 y.o.   MRN: [id number]  HPI  Pt presents to the clinic today for his annual exam.  Flu: 12/2014 Tetanus: 02/2013 Pneumovax: 02/2013  Zostovax: never PSA screening: 12/2014 Colon Screening: never Vision Screening: never Dentist: as needed  Diet: He does eat meat. He does eat fruits and veggies a few days a week. He does eat some fried food. He drinks mostly soda or beer throughout the day. Exercise: None  Review of Systems      Past Medical History:  Diagnosis Date  . Alcohol abuse   . Depression   . Hypertension     Current Outpatient Prescriptions  Medication Sig Dispense Refill  . amLODipine (NORVASC) 5 MG tablet Take 1 tablet (5 mg total) by mouth daily. 90 tablet 1  . aspirin 325 MG tablet Take 325 mg by mouth daily.    Marland Kitchen buPROPion (WELLBUTRIN XL) 300 MG 24 hr tablet Take 1 tablet (300 mg total) by mouth daily. 30 tablet 5  . chlorthalidone (HYGROTON) 25 MG tablet Take 1 tablet (25 mg total) by mouth daily. 30 tablet 5  . FLUoxetine (PROZAC) 20 MG capsule Take 1 capsule (20 mg total) by mouth daily. 30 capsule 5  . Omega-3 Fatty Acids (FISH OIL) 1000 MG CAPS Take 1 capsule by mouth 2 (two) times daily.    . potassium chloride (K-DUR) 10 MEQ tablet Take 1 tablet (10 mEq total) by mouth 2 (two) times daily. 60 tablet 5  . simvastatin (ZOCOR) 20 MG tablet Take 1 tablet (20 mg total) by mouth at bedtime. 90 tablet 1   No current facility-administered medications for this visit.     No Known Allergies  Family History  Problem Relation Age of Onset  . Alcohol abuse Father   . Hypertension Father     Social History   Social History  . Marital status: Married    Spouse name: N/A  . Number of children: N/A  . Years of education: N/A   Occupational History  . Not on file.   Social History Main Topics  . Smoking status: Former Research scientist (life sciences)  . Smokeless tobacco: Not on file     Comment: quit  2015 smoke 30 years  . Alcohol use 1.8 oz/week    3 Cans of beer per week     Comment: daily---40 oz daily  . Drug use: No  . Sexual activity: Not Currently   Other Topics Concern  . Not on file   Social History Narrative  . No narrative on file     Constitutional: Denies fever, malaise, fatigue, headache or abrupt weight changes.  HEENT: Denies eye pain, eye redness, ear pain, ringing in the ears, wax buildup, runny nose, nasal congestion, bloody nose, or sore throat. Respiratory: Denies difficulty breathing, shortness of breath, cough or sputum production.   Cardiovascular: Denies chest pain, chest tightness, palpitations or swelling in the hands or feet.  Gastrointestinal: Denies abdominal pain, bloating, constipation, diarrhea or blood in the stool.  GU: Denies urgency, frequency, pain with urination, burning sensation, blood in urine, odor or discharge. Musculoskeletal: Denies decrease in range of motion, difficulty with gait, muscle pain or joint pain and swelling.  Skin: Denies redness, rashes, lesions or ulcercations.  Neurological: Denies dizziness, difficulty with memory, difficulty with speech or problems with balance and coordination.  Psych: Pt has history of depression. Denies anxiety, SI/HI.  No other specific complaints in  a complete review of systems (except as listed in HPI above).  Objective:   Physical Exam   BP 130/84   Pulse 89   Temp 98.9 F (37.2 C) (Oral)   Ht _0  (1.676 m)   Wt 161 lb 8 oz (73.3 kg)   SpO2 98%   BMI 26.07 kg/m  Wt Readings from Last 3 Encounters:  12/12/15 161 lb 8 oz (73.3 kg)  06/11/15 164 lb (74.4 kg)  05/02/15 162 lb (73.5 kg)    General: Appears his stated age, well developed, well nourished in NAD. Skin: Warm, dry and intact.  HEENT: Head: normal shape and size; Eyes: sclera injected, no icterus, conjunctiva pink, PERRLA and EOMs intact; Right Ear: Tm's gray and intact, normal light reflex; Left Ear: cerumen  impaction; Throat/Mouth: Teeth present, mucosa pink and moist, no exudate, lesions or ulcerations noted.  Neck:  Neck supple, trachea midline. No masses, lumps or thyromegaly present.  Cardiovascular: Normal rate and rhythm. S1,S2 noted.  No murmur, rubs or gallops noted. No JVD or BLE edema. No carotid bruits noted. Pulmonary/Chest: Normal effort and positive vesicular breath sounds. No respiratory distress. No wheezes, rales or ronchi noted.  Abdomen: Soft and nontender. Normal bowel sounds. No distention or masses noted. Liver, spleen and kidneys non palpable. Musculoskeletal: Normal range of motion. Strength 5/5 BUE/BLE. No difficulty with gait.  Neurological: Alert and oriented. Cranial nerves II-XII grossly intact. Coordination normal.  Psychiatric: Mood and affect normal. Behavior is normal. Judgment and thought content normal.     BMET    Component Value Date/Time   NA 140 06/11/2015 1449   K 3.3 (L) 06/11/2015 1449   CL 101 06/11/2015 1449   CO2 29 06/11/2015 1449   GLUCOSE 86 06/11/2015 1449   BUN 11 06/11/2015 1449   CREATININE 1.16 06/11/2015 1449   CALCIUM 9.4 06/11/2015 1449   GFRNONAA >60 06/05/2008 1910   GFRAA  06/05/2008 1910    >60        The eGFR has been calculated using the MDRD equation. This calculation has not been validated in all clinical situations. eGFR's persistently <60 mL/min signify possible Chronic Kidney Disease.    Lipid Panel     Component Value Date/Time   CHOL 226 (H) 06/11/2015 1449   TRIG (H) 06/11/2015 1449    447.0 Triglyceride is over 400; calculations on Lipids are invalid.   HDL 57.30 06/11/2015 1449   CHOLHDL 4 06/11/2015 1449   VLDL 59.2 (H) 12/11/2014 1452    CBC    Component Value Date/Time   WBC 14.6 (H) 06/11/2015 1449   RBC 4.41 06/11/2015 1449   HGB 14.6 06/11/2015 1449   HCT 42.3 06/11/2015 1449   PLT 396.0 06/11/2015 1449   MCV 95.9 06/11/2015 1449   MCHC 34.5 06/11/2015 1449   RDW 13.3 06/11/2015 1449     Hgb A1C No results found for: HGBA1C         Assessment & Plan:   Preventative Health Maintenance:  Flu shot today Tetanus and pneumovax UTD He declines shingles vaccine PSA screening with labs today He declines colonoscopy but is agreeable to Cologuard-ordered Encouraged him to consume a balanced diet and exercise regimen Advised him to see an eye doctor and dentist annually Will check CBC, CMET, lipid and A1C He declines HIV and Hep C screening today  Left ear impacted cerumen:  Attempted manual lavage by CMA-unsuccessful Cerumen moved with cerumen spoon by this provider Advised him to use Debrox OTC 2  x week to prevent wax buildup  RTC in 1 year for annual exam/follow up Webb Silversmith, NP

## 2015-12-13 LAB — COMPREHENSIVE METABOLIC PANEL
ALK PHOS: 66 U/L (ref 39–117)
ALT: 36 U/L (ref 0–53)
AST: 41 U/L — AB (ref 0–37)
Albumin: 4.6 g/dL (ref 3.5–5.2)
BILIRUBIN TOTAL: 0.5 mg/dL (ref 0.2–1.2)
BUN: 13 mg/dL (ref 6–23)
CO2: 32 meq/L (ref 19–32)
CREATININE: 1.42 mg/dL (ref 0.40–1.50)
Calcium: 11.2 mg/dL — ABNORMAL HIGH (ref 8.4–10.5)
Chloride: 92 mEq/L — ABNORMAL LOW (ref 96–112)
GFR: 53.43 mL/min — ABNORMAL LOW (ref >60.00)
GLUCOSE: 100 mg/dL — AB (ref 70–99)
Potassium: 3.5 mEq/L (ref 3.5–5.1)
Sodium: 135 mEq/L (ref 135–145)
TOTAL PROTEIN: 8 g/dL (ref 6.0–8.3)

## 2015-12-13 LAB — HEMOGLOBIN A1C: HEMOGLOBIN A1C: 5.3 % (ref 4.6–6.5)

## 2015-12-13 LAB — LIPID PANEL
CHOL/HDL RATIO: 3
Cholesterol: 192 mg/dL (ref 0–200)
HDL: 63.9 mg/dL (ref >39.00)
LDL Cholesterol: 97 mg/dL (ref 0–99)
NONHDL: 127.74
Triglycerides: 154 mg/dL — ABNORMAL HIGH (ref 0.0–149.0)
VLDL: 30.8 mg/dL (ref 0.0–40.0)

## 2015-12-13 LAB — CBC
HCT: 46.8 % (ref 39.0–52.0)
Hemoglobin: 16.3 g/dL (ref 13.0–17.0)
MCHC: 34.7 g/dL (ref 30.0–36.0)
MCV: 93.8 fl (ref 78.0–100.0)
PLATELETS: 412 10*3/uL — AB (ref 150.0–400.0)
RBC: 4.99 Mil/uL (ref 4.22–5.81)
RDW: 13.1 % (ref 11.5–15.5)
WBC: 15.6 10*3/uL — AB (ref 4.0–10.5)

## 2015-12-13 NOTE — Assessment & Plan Note (Signed)
Increase Norvasc to 10 mg for tighter blood pressure control, eRx sent to pharmacy

## 2016-01-04 ENCOUNTER — Other Ambulatory Visit: Payer: Self-pay | Admitting: Internal Medicine

## 2016-01-13 ENCOUNTER — Encounter: Payer: Self-pay | Admitting: Internal Medicine

## 2016-01-13 ENCOUNTER — Ambulatory Visit (INDEPENDENT_AMBULATORY_CARE_PROVIDER_SITE_OTHER): Payer: BLUE CROSS/BLUE SHIELD | Admitting: Internal Medicine

## 2016-01-13 VITALS — BP 120/78 | HR 90 | Temp 98.1°F | Wt 165.0 lb

## 2016-01-13 DIAGNOSIS — H1132 Conjunctival hemorrhage, left eye: Secondary | ICD-10-CM | POA: Diagnosis not present

## 2016-01-13 NOTE — Progress Notes (Signed)
Subjective:    Patient ID: Bruce Lara, male    DOB: Jul 16, 1950, 66 y.o.   MRN: [id number]  HPI  Pt presents to the clinic today with c/o left eye swelling. He noticed this 3 days ago. He feels like he has something in his eye. He denies itching or discharge. He denies runny nose, sore throat or cough. He has not had any trauma to the eye that he is aware of. He has not tried anything OTC for this.  Review of Systems      Past Medical History:  Diagnosis Date  . Alcohol abuse   . Depression   . Hypertension     Current Outpatient Prescriptions  Medication Sig Dispense Refill  . amLODipine (NORVASC) 10 MG tablet Take 1 tablet (10 mg total) by mouth daily. 90 tablet 3  . aspirin 325 MG tablet Take 325 mg by mouth daily.    Marland Kitchen buPROPion (WELLBUTRIN XL) 300 MG 24 hr tablet Take 1 tablet (300 mg total) by mouth daily. 30 tablet 5  . chlorthalidone (HYGROTON) 25 MG tablet Take 1 tablet (25 mg total) by mouth daily. 30 tablet 5  . FLUoxetine (PROZAC) 20 MG capsule Take 1 capsule (20 mg total) by mouth daily. 30 capsule 5  . Omega-3 Fatty Acids (FISH OIL) 1000 MG CAPS Take 1 capsule by mouth 2 (two) times daily.    . potassium chloride (K-DUR) 10 MEQ tablet Take 1 tablet (10 mEq total) by mouth 2 (two) times daily. 60 tablet 5  . simvastatin (ZOCOR) 20 MG tablet TAKE ONE TABLET BY MOUTH ONCE DAILY AT BEDTIME 90 tablet 3   No current facility-administered medications for this visit.     No Known Allergies  Family History  Problem Relation Age of Onset  . Alcohol abuse Father   . Hypertension Father     Social History   Social History  . Marital status: Married    Spouse name: N/A  . Number of children: N/A  . Years of education: N/A   Occupational History  . Not on file.   Social History Main Topics  . Smoking status: Former Research scientist (life sciences)  . Smokeless tobacco: Never Used     Comment: quit 2015 smoke 30 years  . Alcohol use 1.8 oz/week    3 Cans of beer per week   Comment: daily---40 oz daily  . Drug use: No  . Sexual activity: Not Currently   Other Topics Concern  . Not on file   Social History Narrative  . No narrative on file     Constitutional: Denies fever, malaise, fatigue, headache or abrupt weight changes.  HEENT: Pt reports left eye redness. Denies eye pain,  ear pain, ringing in the ears, wax buildup, runny nose, nasal congestion, bloody nose, or sore throat.  No other specific complaints in a complete review of systems (except as listed in HPI above).  Objective:   Physical Exam BP 120/78   Pulse 90   Temp 98.1 F (36.7 C) (Oral)   Wt 165 lb (74.8 kg)   SpO2 97%   BMI 26.63 kg/m  Wt Readings from Last 3 Encounters:  01/13/16 165 lb (74.8 kg)  12/12/15 161 lb 8 oz (73.3 kg)  06/11/15 164 lb (74.4 kg)    General: Appears his stated age, well developed, well nourished in NAD. HEENT: Head: normal shape and size; Left Eye: sclera with hemorrhage noted, conjunctiva pink, PERRLA and EOMs intact; BMET    Component Value Date/Time  NA 135 12/12/2015 1547   K 3.5 12/12/2015 1547   CL 92 (L) 12/12/2015 1547   CO2 32 12/12/2015 1547   GLUCOSE 100 (H) 12/12/2015 1547   BUN 13 12/12/2015 1547   CREATININE 1.42 12/12/2015 1547   CALCIUM 11.2 (H) 12/12/2015 1547   GFRNONAA >60 06/05/2008 1910   GFRAA  06/05/2008 1910    >60        The eGFR has been calculated using the MDRD equation. This calculation has not been validated in all clinical situations. eGFR's persistently <60 mL/min signify possible Chronic Kidney Disease.    Lipid Panel     Component Value Date/Time   CHOL 192 12/12/2015 1547   TRIG 154.0 (H) 12/12/2015 1547   HDL 63.90 12/12/2015 1547   CHOLHDL 3 12/12/2015 1547   VLDL 30.8 12/12/2015 1547   LDLCALC 97 12/12/2015 1547    CBC    Component Value Date/Time   WBC 15.6 (H) 12/12/2015 1547   RBC 4.99 12/12/2015 1547   HGB 16.3 12/12/2015 1547   HCT 46.8 12/12/2015 1547   PLT 412.0 (H)  12/12/2015 1547   MCV 93.8 12/12/2015 1547   MCHC 34.7 12/12/2015 1547   RDW 13.1 12/12/2015 1547    Hgb A1C Lab Results  Component Value Date   HGBA1C 5.3 12/12/2015        Assessment & Plan:   Subconjunctival Hemorrhage:  Advised him to hold ASA for 2 days Otherwise reassured him that this should resolve on its own Return precautions discussed  Webb Silversmith, NP

## 2016-01-13 NOTE — Patient Instructions (Signed)

## 2016-01-29 ENCOUNTER — Other Ambulatory Visit: Payer: Self-pay | Admitting: Internal Medicine

## 2016-03-19 ENCOUNTER — Telehealth: Payer: Self-pay | Admitting: *Deleted

## 2016-03-19 NOTE — Telephone Encounter (Signed)
Form placed in your box.

## 2016-03-19 NOTE — Telephone Encounter (Signed)
Done, placed in MYD box 

## 2016-03-19 NOTE — Telephone Encounter (Signed)
Altha Harm from [facility] office called regarding a form that was faxed. It appears the form was sent off to [employer]. I have reprinted it and placed it in the Rx tower. Please fax it to the [facility] Department at [phone] when complete.

## 2016-03-24 NOTE — Telephone Encounter (Signed)
Form has been faxed as requested.

## 2016-03-25 ENCOUNTER — Telehealth: Payer: Self-pay

## 2016-03-25 NOTE — Telephone Encounter (Signed)
Pt has scheduled an appt to see Rollene Fare in reference to his denial of gun permit. [place] cannot do anything to reverse the Sherriff's dept decision--- alcohol use and him being treated for depression is in his medical records. They requested those records, there is nothing Rollene Fare can do in reference to that documentation.... Left message on voicemail for pt to return my call as there really is no reason for OV nothing can be altered (documentation) in medical records

## 2016-03-26 ENCOUNTER — Ambulatory Visit: Payer: BLUE CROSS/BLUE SHIELD | Admitting: Internal Medicine

## 2016-04-06 DIAGNOSIS — Z7689 Persons encountering health services in other specified circumstances: Secondary | ICD-10-CM

## 2016-04-12 ENCOUNTER — Other Ambulatory Visit: Payer: Self-pay | Admitting: Internal Medicine

## 2016-06-11 ENCOUNTER — Ambulatory Visit: Payer: BLUE CROSS/BLUE SHIELD | Admitting: Internal Medicine

## 2016-06-15 ENCOUNTER — Ambulatory Visit: Payer: BLUE CROSS/BLUE SHIELD | Admitting: Internal Medicine

## 2016-09-17 ENCOUNTER — Other Ambulatory Visit: Payer: Self-pay | Admitting: *Deleted

## 2016-09-17 DIAGNOSIS — E876 Hypokalemia: Secondary | ICD-10-CM

## 2016-09-17 MED ORDER — POTASSIUM CHLORIDE ER 10 MEQ PO TBCR: 10.0000 meq | EXTENDED_RELEASE_TABLET | Freq: Two times a day (BID) | ORAL | 2 refills | Status: DC

## 2017-01-01 ENCOUNTER — Encounter: Payer: Self-pay | Admitting: Internal Medicine

## 2017-01-01 ENCOUNTER — Ambulatory Visit (INDEPENDENT_AMBULATORY_CARE_PROVIDER_SITE_OTHER): Payer: BLUE CROSS/BLUE SHIELD | Admitting: Internal Medicine

## 2017-01-01 VITALS — BP 122/80 | HR 89 | Temp 98.3°F | Ht 66.0 in | Wt 178.0 lb

## 2017-01-01 DIAGNOSIS — Z0000 Encounter for general adult medical examination without abnormal findings: Secondary | ICD-10-CM | POA: Diagnosis not present

## 2017-01-01 DIAGNOSIS — F339 Major depressive disorder, recurrent, unspecified: Secondary | ICD-10-CM | POA: Diagnosis not present

## 2017-01-01 DIAGNOSIS — Z125 Encounter for screening for malignant neoplasm of prostate: Secondary | ICD-10-CM | POA: Diagnosis not present

## 2017-01-01 DIAGNOSIS — Z1211 Encounter for screening for malignant neoplasm of colon: Secondary | ICD-10-CM | POA: Diagnosis not present

## 2017-01-01 DIAGNOSIS — I10 Essential (primary) hypertension: Secondary | ICD-10-CM | POA: Diagnosis not present

## 2017-01-01 DIAGNOSIS — E7800 Pure hypercholesterolemia, unspecified: Secondary | ICD-10-CM

## 2017-01-01 MED ORDER — FLUOXETINE HCL 20 MG PO CAPS: 20.0000 mg | ORAL_CAPSULE | Freq: Every day | ORAL | 3 refills | Status: DC

## 2017-01-01 NOTE — Assessment & Plan Note (Signed)
Controlled on Amlodipine, Chlorthalidone and Potassium supplement CBC and CMET today Reinforced DASH diet and exercise for weight loss

## 2017-01-01 NOTE — Progress Notes (Signed)
Subjective:    Patient ID: Bruce Lara, male    DOB: 1950/06/30, 66 y.o.   MRN: [id number]  HPI  Pt presents to the clinic today for his annual exam. He is also due to follow up chronic conditions.  HTN: His BP today is 122/80. He is taking Amlodipine, Chlorthalidone and a potassium supplement as prescribed. ECG from 06/2015 reviewed.  HLD: His last LDL was 97, triglycerides 154, 12/2015. He is taking Simvastatin and Fish Oil as prescribed. He does not consume a low fat diet.  Depression: Chronic but stable on Fluoxetine and Wellbutrin. He denies anxiety, SI/HI.  Flu: 12/2015 Tetanus: 03/2013 Pneumovax: 02/2013 Zostovax: never Shingrix: never PSA Screening: 12/2014 Colon Screening: never Vision Screening: as needed Dentist: as needed  Diet: He does eat meat. He consumes some fruits and veggies. He does eat fried foods. He drinks mostly soda. Exercise: None  Review of Systems  Past Medical History:  Diagnosis Date  . Alcohol abuse   . Depression   . Hypertension     Current Outpatient Medications  Medication Sig Dispense Refill  . amLODipine (NORVASC) 10 MG tablet Take 1 tablet (10 mg total) by mouth daily. 90 tablet 3  . aspirin 325 MG tablet Take 325 mg by mouth daily.    Marland Kitchen buPROPion (WELLBUTRIN XL) 300 MG 24 hr tablet TAKE ONE TABLET BY MOUTH  DAILY 90 tablet 3  . chlorthalidone (HYGROTON) 25 MG tablet TAKE ONE TABLET BY MOUTH  DAILY 90 tablet 3  . FLUoxetine (PROZAC) 20 MG capsule TAKE ONE CAPSULE BY MOUTH  DAILY 90 capsule 1  . Omega-3 Fatty Acids (FISH OIL) 1000 MG CAPS Take 1 capsule by mouth 2 (two) times daily.    . potassium chloride (K-DUR) 10 MEQ tablet Take 1 tablet (10 mEq total) by mouth 2 (two) times daily. 60 tablet 2  . simvastatin (ZOCOR) 20 MG tablet TAKE ONE TABLET BY MOUTH ONCE DAILY AT BEDTIME 90 tablet 3   No current facility-administered medications for this visit.     No Known Allergies  Family History  Problem Relation Age of Onset  .  Alcohol abuse Father   . Hypertension Father     Social History   Socioeconomic History  . Marital status: Married    Spouse name: Not on file  . Number of children: Not on file  . Years of education: Not on file  . Highest education level: Not on file  Social Needs  . Financial resource strain: Not on file  . Food insecurity - worry: Not on file  . Food insecurity - inability: Not on file  . Transportation needs - medical: Not on file  . Transportation needs - non-medical: Not on file  Occupational History  . Not on file  Tobacco Use  . Smoking status: Former Research scientist (life sciences)  . Smokeless tobacco: Never Used  . Tobacco comment: quit 2015 smoke 30 years  Substance and Sexual Activity  . Alcohol use: Yes    Alcohol/week: 1.8 oz    Types: 3 Cans of beer per week    Comment: daily---40 oz daily  . Drug use: No  . Sexual activity: Not Currently  Other Topics Concern  . Not on file  Social History Narrative  . Not on file     Constitutional: Denies fever, malaise, fatigue, headache or abrupt weight changes.  HEENT: Denies eye pain, eye redness, ear pain, ringing in the ears, wax buildup, runny nose, nasal congestion, bloody nose, or sore throat.  Respiratory: Denies difficulty breathing, shortness of breath, cough or sputum production.   Cardiovascular: Denies chest pain, chest tightness, palpitations or swelling in the hands or feet.  Gastrointestinal: Pt reports intermittent reflux. Denies abdominal pain, bloating, constipation, diarrhea or blood in the stool.  GU: Denies urgency, frequency, pain with urination, burning sensation, blood in urine, odor or discharge. Musculoskeletal: Denies decrease in range of motion, difficulty with gait, muscle pain or joint pain and swelling.  Skin: Denies redness, rashes, lesions or ulcercations.  Neurological: Denies dizziness, difficulty with memory, difficulty with speech or problems with balance and coordination.  Psych: Pt has a history of  depression. Denies anxiety, SI/HI.  No other specific complaints in a complete review of systems (except as listed in HPI above).     Objective:   Physical Exam   BP 122/80   Pulse 89   Temp 98.3 F (36.8 C) (Oral)   Ht '5\' 6"'$  (1.676 m)   Wt 178 lb (80.7 kg)   SpO2 98%   BMI 28.73 kg/m  Wt Readings from Last 3 Encounters:  01/01/17 178 lb (80.7 kg)  01/13/16 165 lb (74.8 kg)  12/12/15 161 lb 8 oz (73.3 kg)    General: Appears his stated age, obese in NAD. Skin: Warm, dry and intact.  HEENT: Head: normal shape and size; Eyes: sclera injected no icterus, conjunctiva pink, PERRLA and EOMs intact; Ears: Tm's gray and intact, normal light reflex;  Throat/Mouth: Teeth present, mucosa pink and moist, no exudate, lesions or ulcerations noted.  Neck:  Neck supple, trachea midline. No masses, lumps or thyromegaly present.  Cardiovascular: Normal rate and rhythm. S1,S2 noted.  No murmur, rubs or gallops noted. No JVD or BLE edema. No carotid bruits noted. Pulmonary/Chest: Normal effort and positive vesicular breath sounds. No respiratory distress. No wheezes, rales or ronchi noted.  Abdomen: Soft and nontender. Normal bowel sounds. No distention or masses noted. Liver, spleen and kidneys non palpable. Musculoskeletal: Strength 5/5 BUE/BLE. No difficulty with gait.  Neurological: Alert and oriented. Cranial nerves II-XII grossly intact. Coordination normal.  Psychiatric: Mood and affect normal. He does seem impaired today (history of alcoholism). Judgment and thought content normal.     BMET    Component Value Date/Time   NA 135 12/12/2015 1547   K 3.5 12/12/2015 1547   CL 92 (L) 12/12/2015 1547   CO2 32 12/12/2015 1547   GLUCOSE 100 (H) 12/12/2015 1547   BUN 13 12/12/2015 1547   CREATININE 1.42 12/12/2015 1547   CALCIUM 11.2 (H) 12/12/2015 1547   GFRNONAA >60 06/05/2008 1910   GFRAA  06/05/2008 1910    >60        The eGFR has been calculated using the MDRD equation. This  calculation has not been validated in all clinical situations. eGFR's persistently <60 mL/min signify possible Chronic Kidney Disease.    Lipid Panel     Component Value Date/Time   CHOL 192 12/12/2015 1547   TRIG 154.0 (H) 12/12/2015 1547   HDL 63.90 12/12/2015 1547   CHOLHDL 3 12/12/2015 1547   VLDL 30.8 12/12/2015 1547   LDLCALC 97 12/12/2015 1547    CBC    Component Value Date/Time   WBC 15.6 (H) 12/12/2015 1547   RBC 4.99 12/12/2015 1547   HGB 16.3 12/12/2015 1547   HCT 46.8 12/12/2015 1547   PLT 412.0 (H) 12/12/2015 1547   MCV 93.8 12/12/2015 1547   MCHC 34.7 12/12/2015 1547   RDW 13.1 12/12/2015 1547    Hgb  A1C Lab Results  Component Value Date   HGBA1C 5.3 12/12/2015          Assessment & Plan:   Preventative Health Maintenance:  Flu shot today Tetanus and pneumovax UTD He will call his insurance company to check coverage for Shingrix Referral placed to GI for screening colonoscopy Encouraged him to consume a balanced diet and exercise regimen Advised him to see an eye doctor and dentist annually Will check CBC, CMET, Lipid, A1C and PSA today  RTC in 1 year, sooner if needed Webb Silversmith, NP

## 2017-01-01 NOTE — Assessment & Plan Note (Signed)
Chronic but stable on Prozac and Wellbutrin Prozac refilled today Support offered

## 2017-01-01 NOTE — Assessment & Plan Note (Signed)
Encouraged him to consume a low fat diet CMET, Lipid and A1C today Continue Simvastatin and Fish Oil for now

## 2017-01-01 NOTE — Patient Instructions (Signed)

## 2017-01-02 LAB — COMPREHENSIVE METABOLIC PANEL
AG RATIO: 1.5 (calc) (ref 1.0–2.5)
ALBUMIN MSPROF: 3.9 g/dL (ref 3.6–5.1)
ALT: 13 U/L (ref 9–46)
AST: 23 U/L (ref 10–35)
Alkaline phosphatase (APISO): 63 U/L (ref 40–115)
BILIRUBIN TOTAL: 0.4 mg/dL (ref 0.2–1.2)
BUN / CREAT RATIO: 6 (calc) (ref 6–22)
BUN: 9 mg/dL (ref 7–25)
CO2: 28 mmol/L (ref 20–32)
Calcium: 9.4 mg/dL (ref 8.6–10.3)
Chloride: 102 mmol/L (ref 98–110)
Creat: 1.52 mg/dL — ABNORMAL HIGH (ref 0.70–1.25)
GLOBULIN: 2.6 g/dL (ref 1.9–3.7)
GLUCOSE: 96 mg/dL (ref 65–99)
POTASSIUM: 4.1 mmol/L (ref 3.5–5.3)
SODIUM: 141 mmol/L (ref 135–146)
TOTAL PROTEIN: 6.5 g/dL (ref 6.1–8.1)

## 2017-01-02 LAB — CBC
HEMATOCRIT: 43.2 % (ref 38.5–50.0)
HEMOGLOBIN: 15.4 g/dL (ref 13.2–17.1)
MCH: 33.3 pg — ABNORMAL HIGH (ref 27.0–33.0)
MCHC: 35.6 g/dL (ref 32.0–36.0)
MCV: 93.3 fL (ref 80.0–100.0)
MPV: 9.2 fL (ref 7.5–12.5)
Platelets: 401 10*3/uL — ABNORMAL HIGH (ref 140–400)
RBC: 4.63 10*6/uL (ref 4.20–5.80)
RDW: 12.6 % (ref 11.0–15.0)
WBC: 11.1 10*3/uL — AB (ref 3.8–10.8)

## 2017-01-02 LAB — HEMOGLOBIN A1C
HEMOGLOBIN A1C: 5.4 %{Hb} (ref <5.7)
Mean Plasma Glucose: 108 (calc)
eAG (mmol/L): 6 (calc)

## 2017-01-02 LAB — LIPID PANEL
Cholesterol: 240 mg/dL — ABNORMAL HIGH (ref <200)
HDL: 55 mg/dL (ref >40)
LDL Cholesterol (Calc): 141 mg/dL (calc) — ABNORMAL HIGH
NON-HDL CHOLESTEROL (CALC): 185 mg/dL — AB (ref <130)
Total CHOL/HDL Ratio: 4.4 (calc) (ref <5.0)
Triglycerides: 287 mg/dL — ABNORMAL HIGH (ref <150)

## 2017-01-02 LAB — PSA: PSA: 0.7 ng/mL (ref <=4.0)

## 2017-01-11 ENCOUNTER — Encounter: Payer: Self-pay | Admitting: Internal Medicine

## 2017-01-11 MED ORDER — SIMVASTATIN 40 MG PO TABS: 40.0000 mg | ORAL_TABLET | Freq: Every day | ORAL | 0 refills | Status: DC

## 2017-01-11 NOTE — Addendum Note (Signed)
Addended by: Modena Nunnery on: 01/11/2017 02:02 PM   Modules accepted: Orders

## 2017-01-25 ENCOUNTER — Other Ambulatory Visit (INDEPENDENT_AMBULATORY_CARE_PROVIDER_SITE_OTHER): Payer: BLUE CROSS/BLUE SHIELD

## 2017-01-25 DIAGNOSIS — E7800 Pure hypercholesterolemia, unspecified: Secondary | ICD-10-CM | POA: Diagnosis not present

## 2017-01-25 LAB — LIPID PANEL
CHOL/HDL RATIO: 3
Cholesterol: 166 mg/dL (ref 0–200)
HDL: 58 mg/dL (ref >39.00)
LDL Cholesterol: 77 mg/dL (ref 0–99)
NONHDL: 107.78
Triglycerides: 156 mg/dL — ABNORMAL HIGH (ref 0.0–149.0)
VLDL: 31.2 mg/dL (ref 0.0–40.0)

## 2017-01-25 LAB — COMPREHENSIVE METABOLIC PANEL
ALT: 34 U/L (ref 0–53)
AST: 42 U/L — AB (ref 0–37)
Albumin: 4.4 g/dL (ref 3.5–5.2)
Alkaline Phosphatase: 63 U/L (ref 39–117)
BUN: 11 mg/dL (ref 6–23)
CO2: 33 meq/L — AB (ref 19–32)
CREATININE: 1.37 mg/dL (ref 0.40–1.50)
Calcium: 9.9 mg/dL (ref 8.4–10.5)
Chloride: 92 mEq/L — ABNORMAL LOW (ref 96–112)
GFR: 55.49 mL/min — AB (ref >60.00)
GLUCOSE: 108 mg/dL — AB (ref 70–99)
Potassium: 3.2 mEq/L — ABNORMAL LOW (ref 3.5–5.1)
SODIUM: 135 meq/L (ref 135–145)
Total Bilirubin: 0.7 mg/dL (ref 0.2–1.2)
Total Protein: 7.6 g/dL (ref 6.0–8.3)

## 2017-02-12 ENCOUNTER — Other Ambulatory Visit: Payer: Self-pay | Admitting: Internal Medicine

## 2017-02-12 DIAGNOSIS — E876 Hypokalemia: Secondary | ICD-10-CM

## 2017-02-18 ENCOUNTER — Ambulatory Visit: Payer: BLUE CROSS/BLUE SHIELD | Admitting: Internal Medicine

## 2017-02-18 VITALS — BP 120/74 | HR 80 | Temp 98.2°F | Wt 170.0 lb

## 2017-02-18 DIAGNOSIS — I10 Essential (primary) hypertension: Secondary | ICD-10-CM | POA: Diagnosis not present

## 2017-02-18 DIAGNOSIS — E876 Hypokalemia: Secondary | ICD-10-CM | POA: Diagnosis not present

## 2017-02-18 LAB — BASIC METABOLIC PANEL
BUN: 10 mg/dL (ref 6–23)
CO2: 34 mEq/L — ABNORMAL HIGH (ref 19–32)
Calcium: 10 mg/dL (ref 8.4–10.5)
Chloride: 94 mEq/L — ABNORMAL LOW (ref 96–112)
Creatinine, Ser: 1.4 mg/dL (ref 0.40–1.50)
GFR: 54.1 mL/min — AB (ref >60.00)
GLUCOSE: 119 mg/dL — AB (ref 70–99)
POTASSIUM: 3.3 meq/L — AB (ref 3.5–5.1)
Sodium: 134 mEq/L — ABNORMAL LOW (ref 135–145)

## 2017-02-23 ENCOUNTER — Ambulatory Visit (AMBULATORY_SURGERY_CENTER): Payer: Self-pay

## 2017-02-23 ENCOUNTER — Other Ambulatory Visit: Payer: Self-pay

## 2017-02-23 ENCOUNTER — Encounter: Payer: Self-pay | Admitting: Internal Medicine

## 2017-02-23 VITALS — Ht 66.0 in | Wt 171.0 lb

## 2017-02-23 DIAGNOSIS — Z1211 Encounter for screening for malignant neoplasm of colon: Secondary | ICD-10-CM

## 2017-02-23 MED ORDER — NA SULFATE-K SULFATE-MG SULF 17.5-3.13-1.6 GM/177ML PO SOLN: 1.0000 | Freq: Once | ORAL | 0 refills | Status: AC

## 2017-02-23 NOTE — Progress Notes (Signed)
Subjective:    Patient ID: Bruce Lara, male    DOB: Dec 26, 1950, 67 y.o.   MRN: [id number]  HPI  Pt presents to the clinic today for 3 week follow up of HTN. At his last visit, after his labs came back, he was advised to stop his Chlorthalidone and Potassium supplement, due to decreased GFR and hypokalemia. He was advised to continue his Amlodipine. He reports he does not recall any of the conversation, and has continued to take all 3 meds. He denies dizziness, chest pain, shortness of breath or muscle cramping. His BP today is 120/74.  Review of Systems      Past Medical History:  Diagnosis Date  . Alcohol abuse   . Depression   . Hypertension     Current Outpatient Medications  Medication Sig Dispense Refill  . amLODipine (NORVASC) 10 MG tablet Take 1 tablet (10 mg total) by mouth daily. 90 tablet 3  . aspirin 325 MG tablet Take 325 mg by mouth daily.    Marland Kitchen buPROPion (WELLBUTRIN XL) 300 MG 24 hr tablet TAKE ONE TABLET BY MOUTH  DAILY 90 tablet 3  . chlorthalidone (HYGROTON) 25 MG tablet TAKE ONE TABLET BY MOUTH  DAILY 90 tablet 3  . FLUoxetine (PROZAC) 20 MG capsule Take 1 capsule (20 mg total) by mouth daily. 90 capsule 3  . Omega-3 Fatty Acids (FISH OIL) 1000 MG CAPS Take 1 capsule by mouth 4 (four) times daily.     . potassium chloride (K-DUR) 10 MEQ tablet Take 1 tablet (10 mEq total) by mouth 2 (two) times daily. 60 tablet 2  . simvastatin (ZOCOR) 40 MG tablet Take 1 tablet (40 mg total) by mouth at bedtime. 90 tablet 0   No current facility-administered medications for this visit.     No Known Allergies  Family History  Problem Relation Age of Onset  . Alcohol abuse Father   . Hypertension Father     Social History   Socioeconomic History  . Marital status: Married    Spouse name: Not on file  . Number of children: Not on file  . Years of education: Not on file  . Highest education level: Not on file  Social Needs  . Financial resource strain: Not on file    . Food insecurity - worry: Not on file  . Food insecurity - inability: Not on file  . Transportation needs - medical: Not on file  . Transportation needs - non-medical: Not on file  Occupational History  . Not on file  Tobacco Use  . Smoking status: Former Research scientist (life sciences)  . Smokeless tobacco: Never Used  . Tobacco comment: quit 2015 smoke 30 years  Substance and Sexual Activity  . Alcohol use: Yes    Alcohol/week: 1.8 oz    Types: 3 Cans of beer per week    Comment: daily---40 oz daily  . Drug use: No  . Sexual activity: Not Currently  Other Topics Concern  . Not on file  Social History Narrative  . Not on file     Constitutional: Denies fever, malaise, fatigue, headache or abrupt weight changes.  Respiratory: Denies difficulty breathing, shortness of breath, cough or sputum production.   Cardiovascular: Denies chest pain, chest tightness, palpitations or swelling in the hands or feet.  Neurological: Denies dizziness, difficulty with memory, difficulty with speech or problems with balance and coordination.    No other specific complaints in a complete review of systems (except as listed in HPI above).  Objective:   Physical Exam   BP 120/74   Pulse 80   Temp 98.2 F (36.8 C) (Oral)   Wt 170 lb (77.1 kg)   SpO2 98%   BMI 27.44 kg/m  Wt Readings from Last 3 Encounters:  02/18/17 170 lb (77.1 kg)  01/01/17 178 lb (80.7 kg)  01/13/16 165 lb (74.8 kg)    General: Appears his stated age, inebriated, in NAD. HEENT:Eyes: sclera injected, PERRLA and EOMs intact;  Cardiovascular: Normal rate and rhythm. S1,S2 noted.  No murmur, rubs or gallops noted. No JVD or BLE edema.  Pulmonary/Chest: Normal effort and positive vesicular breath sounds. No respiratory distress. No wheezes, rales or ronchi noted.  Neurological: Alert and oriented.    BMET    Component Value Date/Time   NA 134 (L) 02/18/2017 1239   K 3.3 (L) 02/18/2017 1239   CL 94 (L) 02/18/2017 1239   CO2 34 (H)  02/18/2017 1239   GLUCOSE 119 (H) 02/18/2017 1239   BUN 10 02/18/2017 1239   CREATININE 1.40 02/18/2017 1239   CREATININE 1.52 (H) 01/01/2017 1547   CALCIUM 10.0 02/18/2017 1239   GFRNONAA >60 06/05/2008 1910   GFRAA  06/05/2008 1910    >60        The eGFR has been calculated using the MDRD equation. This calculation has not been validated in all clinical situations. eGFR's persistently <60 mL/min signify possible Chronic Kidney Disease.    Lipid Panel     Component Value Date/Time   CHOL 166 01/25/2017 1304   TRIG 156.0 (H) 01/25/2017 1304   HDL 58.00 01/25/2017 1304   CHOLHDL 3 01/25/2017 1304   VLDL 31.2 01/25/2017 1304   LDLCALC 77 01/25/2017 1304    CBC    Component Value Date/Time   WBC 11.1 (H) 01/01/2017 1547   RBC 4.63 01/01/2017 1547   HGB 15.4 01/01/2017 1547   HCT 43.2 01/01/2017 1547   PLT 401 (H) 01/01/2017 1547   MCV 93.3 01/01/2017 1547   MCH 33.3 (H) 01/01/2017 1547   MCHC 35.6 01/01/2017 1547   RDW 12.6 01/01/2017 1547    Hgb A1C Lab Results  Component Value Date   HGBA1C 5.4 01/01/2017           Assessment & Plan:

## 2017-02-23 NOTE — Assessment & Plan Note (Signed)
Will check BMET today to see if his kidney function and potassium have improved If they have, will continue Clorthalidone and Potassium If not, will d/c both and continue Amlodipine  Will follow up after labs, return precautions discussed Webb Silversmith, NP

## 2017-02-23 NOTE — Patient Instructions (Signed)

## 2017-02-25 NOTE — Addendum Note (Signed)
Addended by: Lurlean Nanny on: 02/25/2017 02:37 PM   Modules accepted: Orders

## 2017-03-09 ENCOUNTER — Other Ambulatory Visit: Payer: Self-pay

## 2017-03-09 ENCOUNTER — Ambulatory Visit (AMBULATORY_SURGERY_CENTER): Payer: BLUE CROSS/BLUE SHIELD | Admitting: Internal Medicine

## 2017-03-09 ENCOUNTER — Encounter: Payer: Self-pay | Admitting: Internal Medicine

## 2017-03-09 VITALS — BP 124/77 | HR 77 | Temp 97.3°F | Resp 18 | Ht 66.0 in | Wt 171.0 lb

## 2017-03-09 DIAGNOSIS — D120 Benign neoplasm of cecum: Secondary | ICD-10-CM | POA: Diagnosis not present

## 2017-03-09 DIAGNOSIS — D123 Benign neoplasm of transverse colon: Secondary | ICD-10-CM | POA: Diagnosis not present

## 2017-03-09 DIAGNOSIS — Z1211 Encounter for screening for malignant neoplasm of colon: Secondary | ICD-10-CM

## 2017-03-09 DIAGNOSIS — D124 Benign neoplasm of descending colon: Secondary | ICD-10-CM | POA: Diagnosis not present

## 2017-03-09 MED ORDER — SODIUM CHLORIDE 0.9 % IV SOLN: 500.0000 mL | Freq: Once | INTRAVENOUS | Status: DC

## 2017-03-09 NOTE — Progress Notes (Signed)
Report to PACU, RN, vss, BBS= Clear.  

## 2017-03-09 NOTE — Op Note (Signed)
[place] Patient Name: Bruce Lara Procedure Date: 03/09/2017 9:50 AM MRN: [id number] Endoscopist: Jerene Bears , MD Age: 67 Referring MD:  Date of Birth: [birth year] Gender: Male Account #: [id number] Procedure:                Colonoscopy Indications:              Screening for colorectal malignant neoplasm, This                            is the patient's first colonoscopy Medicines:                Monitored Anesthesia Care Procedure:                Pre-Anesthesia Assessment:                           - Prior to the procedure, a History and Physical                            was performed, and patient medications and                            allergies were reviewed. The patient's tolerance of                            previous anesthesia was also reviewed. The risks                            and benefits of the procedure and the sedation                            options and risks were discussed with the patient.                            All questions were answered, and informed consent                            was obtained. Prior Anticoagulants: The patient has                            taken no previous anticoagulant or antiplatelet                            agents. ASA Grade Assessment: II - A patient with                            mild systemic disease. After reviewing the risks                            and benefits, the patient was deemed in                            satisfactory condition to undergo the procedure.  After obtaining informed consent, the colonoscope                            was passed under direct vision. Throughout the                            procedure, the patient's blood pressure, pulse, and                            oxygen saturations were monitored continuously. The                            Colonoscope was introduced through the anus and                            advanced to the the cecum,  identified by                            appendiceal orifice and ileocecal valve. The                            colonoscopy was performed without difficulty. The                            patient tolerated the procedure well. The quality                            of the bowel preparation was good. The ileocecal                            valve, appendiceal orifice, and rectum were                            photographed. Scope In: 9:55:04 AM Scope Out: 10:13:26 AM Scope Withdrawal Time: 0 hours 15 minutes 5 seconds  Total Procedure Duration: 0 hours 18 minutes 22 seconds  Findings:                 The digital rectal exam was normal.                           A 5 mm polyp was found in the cecum. The polyp was                            sessile. The polyp was removed with a cold snare.                            Resection and retrieval were complete.                           A 5 mm polyp was found in the transverse colon. The                            polyp was sessile. The polyp was removed  with a                            cold snare. Resection and retrieval were complete.                           Two sessile polyps were found in the descending                            colon. The polyps were 4 to 7 mm in size. These                            polyps were removed with a cold snare. Resection                            and retrieval were complete.                           Multiple diverticula were found in the sigmoid                            colon. There was no evidence of diverticular                            bleeding.                           Internal hemorrhoids were found during                            retroflexion. The hemorrhoids were medium-sized. Complications:            No immediate complications. Estimated Blood Loss:     Estimated blood loss was minimal. Impression:               - One 5 mm polyp in the cecum, removed with a cold                             snare. Resected and retrieved.                           - One 5 mm polyp in the transverse colon, removed                            with a cold snare. Resected and retrieved.                           - Two 4 to 7 mm polyps in the descending colon,                            removed with a cold snare. Resected and retrieved.                           - Mild diverticulosis in the sigmoid colon. There  was no evidence of diverticular bleeding.                           - Internal hemorrhoids. Recommendation:           - Patient has a contact number available for                            emergencies. The signs and symptoms of potential                            delayed complications were discussed with the                            patient. Return to normal activities tomorrow.                            Written discharge instructions were provided to the                            patient.                           - Resume previous diet.                           - Continue present medications.                           - Await pathology results.                           - Repeat colonoscopy is recommended for                            surveillance. The colonoscopy date will be                            determined after pathology results from today's                            exam become available for review. Jerene Bears, MD 03/09/2017 10:18:05 AM This report has been signed electronically.

## 2017-03-09 NOTE — Progress Notes (Signed)
Called to room to assist during endoscopic procedure.  Patient ID and intended procedure confirmed with present staff. Received instructions for my participation in the procedure from the performing physician.  

## 2017-03-09 NOTE — Patient Instructions (Signed)
**  handouts given on polyps, diverticulosis and hemorrhoids**   YOU HAD AN ENDOSCOPIC PROCEDURE TODAY: Refer to the procedure report and other information in the discharge instructions given to you for any specific questions about what was found during the examination. If this information does not answer your questions, please call [place] office at [phone] to clarify.   YOU SHOULD EXPECT: Some feelings of bloating in the abdomen. Passage of more gas than usual. Walking can help get rid of the air that was put into your GI tract during the procedure and reduce the bloating. If you had a lower endoscopy (such as a colonoscopy or flexible sigmoidoscopy) you may notice spotting of blood in your stool or on the toilet paper. Some abdominal soreness may be present for a day or two, also.  DIET: Your first meal following the procedure should be a light meal and then it is ok to progress to your normal diet. A half-sandwich or bowl of soup is an example of a good first meal. Heavy or fried foods are harder to digest and may make you feel nauseous or bloated. Drink plenty of fluids but you should avoid alcoholic beverages for 24 hours. If you had a esophageal dilation, please see attached instructions for diet.    ACTIVITY: Your care partner should take you home directly after the procedure. You should plan to take it easy, moving slowly for the rest of the day. You can resume normal activity the day after the procedure however YOU SHOULD NOT DRIVE, use power tools, machinery or perform tasks that involve climbing or major physical exertion for 24 hours (because of the sedation medicines used during the test).   SYMPTOMS TO REPORT IMMEDIATELY: A gastroenterologist can be reached at any hour. Please call [phone]  for any of the following symptoms:  Following lower endoscopy (colonoscopy, flexible sigmoidoscopy) Excessive amounts of blood in the stool  Significant tenderness, worsening of abdominal  pains  Swelling of the abdomen that is new, acute  Fever of 100 or higher    FOLLOW UP:  If any biopsies were taken you will be contacted by phone or by letter within the next 1-3 weeks. Call [phone]  if you have not heard about the biopsies in 3 weeks.  Please also call with any specific questions about appointments or follow up tests.

## 2017-03-10 ENCOUNTER — Telehealth: Payer: Self-pay | Admitting: *Deleted

## 2017-03-10 NOTE — Telephone Encounter (Signed)
Unable to contact the number given and on Snapshot is the incorrect number.

## 2017-03-12 ENCOUNTER — Other Ambulatory Visit: Payer: Self-pay | Admitting: Internal Medicine

## 2017-03-12 DIAGNOSIS — E876 Hypokalemia: Secondary | ICD-10-CM

## 2017-03-12 MED ORDER — POTASSIUM CHLORIDE CRYS ER 20 MEQ PO TBCR: 20.0000 meq | EXTENDED_RELEASE_TABLET | Freq: Two times a day (BID) | ORAL | 3 refills | Status: DC

## 2017-03-12 MED ORDER — POTASSIUM CHLORIDE CRYS ER 20 MEQ PO TBCR: 20.0000 meq | EXTENDED_RELEASE_TABLET | Freq: Every day | ORAL | 3 refills | Status: DC

## 2017-03-12 NOTE — Addendum Note (Signed)
Addended by: Lurlean Nanny on: 03/12/2017 05:25 PM   Modules accepted: Orders

## 2017-03-16 ENCOUNTER — Encounter: Payer: Self-pay | Admitting: Internal Medicine

## 2017-03-19 ENCOUNTER — Other Ambulatory Visit (INDEPENDENT_AMBULATORY_CARE_PROVIDER_SITE_OTHER): Payer: BLUE CROSS/BLUE SHIELD

## 2017-03-19 DIAGNOSIS — E876 Hypokalemia: Secondary | ICD-10-CM | POA: Diagnosis not present

## 2017-03-19 LAB — BASIC METABOLIC PANEL
BUN: 13 mg/dL (ref 6–23)
CALCIUM: 9.8 mg/dL (ref 8.4–10.5)
CO2: 29 meq/L (ref 19–32)
CREATININE: 1.48 mg/dL (ref 0.40–1.50)
Chloride: 101 mEq/L (ref 96–112)
GFR: 50.73 mL/min — ABNORMAL LOW (ref >60.00)
GLUCOSE: 121 mg/dL — AB (ref 70–99)
Potassium: 4.3 mEq/L (ref 3.5–5.1)
Sodium: 137 mEq/L (ref 135–145)

## 2017-03-23 ENCOUNTER — Encounter: Payer: Self-pay | Admitting: Internal Medicine

## 2017-03-24 ENCOUNTER — Other Ambulatory Visit: Payer: Self-pay | Admitting: Internal Medicine

## 2017-03-24 DIAGNOSIS — R944 Abnormal results of kidney function studies: Secondary | ICD-10-CM

## 2017-03-24 NOTE — Progress Notes (Signed)
Ab

## 2017-04-12 ENCOUNTER — Other Ambulatory Visit: Payer: BLUE CROSS/BLUE SHIELD

## 2017-04-22 ENCOUNTER — Emergency Department (HOSPITAL_COMMUNITY): Payer: BLUE CROSS/BLUE SHIELD

## 2017-04-22 ENCOUNTER — Encounter (HOSPITAL_COMMUNITY): Payer: Self-pay | Admitting: Emergency Medicine

## 2017-04-22 ENCOUNTER — Other Ambulatory Visit: Payer: Self-pay

## 2017-04-22 ENCOUNTER — Emergency Department (HOSPITAL_COMMUNITY)
Admission: EM | Admit: 2017-04-22 | Discharge: 2017-04-22 | Disposition: A | Discharge status: Home / Self Care | Payer: BLUE CROSS/BLUE SHIELD | Attending: Emergency Medicine | Admitting: Emergency Medicine

## 2017-04-22 DIAGNOSIS — Y9389 Activity, other specified: Secondary | ICD-10-CM | POA: Diagnosis not present

## 2017-04-22 DIAGNOSIS — I10 Essential (primary) hypertension: Secondary | ICD-10-CM | POA: Insufficient documentation

## 2017-04-22 DIAGNOSIS — Z79899 Other long term (current) drug therapy: Secondary | ICD-10-CM | POA: Insufficient documentation

## 2017-04-22 DIAGNOSIS — Z7982 Long term (current) use of aspirin: Secondary | ICD-10-CM | POA: Diagnosis not present

## 2017-04-22 DIAGNOSIS — Y929 Unspecified place or not applicable: Secondary | ICD-10-CM | POA: Diagnosis not present

## 2017-04-22 DIAGNOSIS — W3400XA Accidental discharge from unspecified firearms or gun, initial encounter: Secondary | ICD-10-CM | POA: Diagnosis not present

## 2017-04-22 DIAGNOSIS — S71102A Unspecified open wound, left thigh, initial encounter: Secondary | ICD-10-CM | POA: Diagnosis not present

## 2017-04-22 DIAGNOSIS — Y999 Unspecified external cause status: Secondary | ICD-10-CM | POA: Diagnosis not present

## 2017-04-22 DIAGNOSIS — Z87891 Personal history of nicotine dependence: Secondary | ICD-10-CM | POA: Insufficient documentation

## 2017-04-22 DIAGNOSIS — S79922A Unspecified injury of left thigh, initial encounter: Secondary | ICD-10-CM | POA: Diagnosis present

## 2017-04-22 LAB — PROTIME-INR
INR: 0.97
PROTHROMBIN TIME: 12.8 s (ref 11.4–15.2)

## 2017-04-22 LAB — TYPE AND SCREEN
ABO/RH(D): O POS
Antibody Screen: NEGATIVE

## 2017-04-22 LAB — CBC WITH DIFFERENTIAL/PLATELET
Basophils Absolute: 0.1 10*3/uL (ref 0.0–0.1)
Basophils Relative: 0 %
Eosinophils Absolute: 0.2 10*3/uL (ref 0.0–0.7)
Eosinophils Relative: 2 %
HCT: 47.3 % (ref 39.0–52.0)
HEMOGLOBIN: 15.8 g/dL (ref 13.0–17.0)
LYMPHS ABS: 5 10*3/uL — AB (ref 0.7–4.0)
Lymphocytes Relative: 43 %
MCH: 32.8 pg (ref 26.0–34.0)
MCHC: 33.4 g/dL (ref 30.0–36.0)
MCV: 98.3 fL (ref 78.0–100.0)
MONOS PCT: 8 %
Monocytes Absolute: 0.9 10*3/uL (ref 0.1–1.0)
NEUTROS ABS: 5.6 10*3/uL (ref 1.7–7.7)
NEUTROS PCT: 47 %
Platelets: 332 10*3/uL (ref 150–400)
RBC: 4.81 MIL/uL (ref 4.22–5.81)
RDW: 13.5 % (ref 11.5–15.5)
WBC: 11.8 10*3/uL — AB (ref 4.0–10.5)

## 2017-04-22 LAB — BASIC METABOLIC PANEL
Anion gap: 11 (ref 5–15)
BUN: 7 mg/dL (ref 6–20)
CHLORIDE: 108 mmol/L (ref 101–111)
CO2: 19 mmol/L — AB (ref 22–32)
Calcium: 9.1 mg/dL (ref 8.9–10.3)
Creatinine, Ser: 1.31 mg/dL — ABNORMAL HIGH (ref 0.61–1.24)
GFR calc Af Amer: >60 mL/min (ref >60)
GFR calc non Af Amer: 56 mL/min — ABNORMAL LOW (ref >60)
Glucose, Bld: 111 mg/dL — ABNORMAL HIGH (ref 65–99)
POTASSIUM: 3.6 mmol/L (ref 3.5–5.1)
SODIUM: 138 mmol/L (ref 135–145)

## 2017-04-22 LAB — I-STAT CHEM 8, ED
BUN: 8 mg/dL (ref 6–20)
CHLORIDE: 106 mmol/L (ref 101–111)
CREATININE: 1.3 mg/dL — AB (ref 0.61–1.24)
Calcium, Ion: 1.18 mmol/L (ref 1.15–1.40)
Glucose, Bld: 110 mg/dL — ABNORMAL HIGH (ref 65–99)
HEMATOCRIT: 44 % (ref 39.0–52.0)
HEMOGLOBIN: 15 g/dL (ref 13.0–17.0)
Potassium: 5.4 mmol/L — ABNORMAL HIGH (ref 3.5–5.1)
Sodium: 139 mmol/L (ref 135–145)
TCO2: 24 mmol/L (ref 22–32)

## 2017-04-22 LAB — ABO/RH: ABO/RH(D): O POS

## 2017-04-22 MED ORDER — HYDROCODONE-ACETAMINOPHEN 5-325 MG PO TABS: 1.0000 | ORAL_TABLET | Freq: Four times a day (QID) | ORAL | 0 refills | Status: DC | PRN

## 2017-04-22 MED ORDER — IOPAMIDOL (ISOVUE-370) INJECTION 76%
INTRAVENOUS | Status: AC
  Filled 2017-04-22: qty 100

## 2017-04-22 MED ORDER — CEFAZOLIN SODIUM-DEXTROSE 2-4 GM/100ML-% IV SOLN
2.0000 g | Freq: Once | INTRAVENOUS | Status: AC
  Administered 2017-04-22: 2 g via INTRAVENOUS
  Filled 2017-04-22: qty 100

## 2017-04-22 MED ORDER — IOPAMIDOL (ISOVUE-370) INJECTION 76%
INTRAVENOUS | Status: AC
  Administered 2017-04-22: 100 mL
  Filled 2017-04-22: qty 100

## 2017-04-22 MED ORDER — ONDANSETRON HCL 4 MG/2ML IJ SOLN
4.0000 mg | Freq: Once | INTRAMUSCULAR | Status: AC
  Administered 2017-04-22: 4 mg via INTRAVENOUS
  Filled 2017-04-22: qty 2

## 2017-04-22 MED ORDER — CLINDAMYCIN HCL 300 MG PO CAPS: 300.0000 mg | ORAL_CAPSULE | Freq: Three times a day (TID) | ORAL | 0 refills | Status: AC

## 2017-04-22 MED ORDER — MORPHINE SULFATE (PF) 4 MG/ML IV SOLN
4.0000 mg | Freq: Once | INTRAVENOUS | Status: AC
  Administered 2017-04-22: 4 mg via INTRAVENOUS
  Filled 2017-04-22: qty 1
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
## 2017-04-22 NOTE — ED Notes (Signed)
Sitting upright on stretcher - awake, alert, conversant - awaiting radiology results - pt aware of same; reports "soreness" to LLE at site if injury; no active bleeding noted at this time from entrance and exit wound to LLE - ecchymosis and hematoma noted to apparent entrance wound to left thigh slightly extending distally

## 2017-04-22 NOTE — ED Notes (Signed)
Pt stable, ambulatory, states understanding of discharge instructions 
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
## 2017-04-22 NOTE — ED Notes (Signed)
Patient transported to CT 
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
## 2017-04-22 NOTE — Discharge Instructions (Addendum)
Apply a clean dressing at least once daily. After a bath, apply a wet gauze to each wound. Then, use a kerlix (cotton roll) to wrap the wounds. Do this at least once per day until healed.

## 2017-04-23 ENCOUNTER — Other Ambulatory Visit: Payer: Self-pay | Admitting: Internal Medicine

## 2017-04-27 ENCOUNTER — Ambulatory Visit: Payer: BLUE CROSS/BLUE SHIELD | Admitting: Internal Medicine

## 2017-04-27 VITALS — BP 132/78 | HR 103 | Ht 66.0 in | Wt 172.0 lb

## 2017-04-27 DIAGNOSIS — S81832D Puncture wound without foreign body, left lower leg, subsequent encounter: Secondary | ICD-10-CM | POA: Diagnosis not present

## 2017-04-27 DIAGNOSIS — W3400XD Accidental discharge from unspecified firearms or gun, subsequent encounter: Secondary | ICD-10-CM | POA: Diagnosis not present

## 2017-04-27 MED ORDER — HYDROCODONE-ACETAMINOPHEN 5-325 MG PO TABS: 1.0000 | ORAL_TABLET | Freq: Three times a day (TID) | ORAL | 0 refills | Status: DC | PRN

## 2017-04-28 ENCOUNTER — Ambulatory Visit: Payer: BLUE CROSS/BLUE SHIELD | Admitting: Internal Medicine
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
## 2017-05-10 DIAGNOSIS — I129 Hypertensive chronic kidney disease with stage 1 through stage 4 chronic kidney disease, or unspecified chronic kidney disease: Secondary | ICD-10-CM | POA: Diagnosis not present

## 2017-05-10 DIAGNOSIS — N183 Chronic kidney disease, stage 3 (moderate): Secondary | ICD-10-CM | POA: Diagnosis not present

## 2017-05-17 ENCOUNTER — Other Ambulatory Visit: Payer: Self-pay | Admitting: Nephrology

## 2017-05-17 DIAGNOSIS — N183 Chronic kidney disease, stage 3 unspecified: Secondary | ICD-10-CM

## 2017-05-17 DIAGNOSIS — I129 Hypertensive chronic kidney disease with stage 1 through stage 4 chronic kidney disease, or unspecified chronic kidney disease: Secondary | ICD-10-CM

## 2017-07-02 ENCOUNTER — Ambulatory Visit
Admission: RE | Admit: 2017-07-02 | Discharge: 2017-07-02 | Disposition: A | Discharge status: Home / Self Care | Payer: BLUE CROSS/BLUE SHIELD | Source: Ambulatory Visit | Attending: Nephrology | Admitting: Nephrology

## 2017-07-02 DIAGNOSIS — I129 Hypertensive chronic kidney disease with stage 1 through stage 4 chronic kidney disease, or unspecified chronic kidney disease: Secondary | ICD-10-CM

## 2017-07-02 DIAGNOSIS — N183 Chronic kidney disease, stage 3 unspecified: Secondary | ICD-10-CM

## 2017-07-14 ENCOUNTER — Telehealth: Payer: Self-pay | Admitting: Internal Medicine

## 2017-07-14 NOTE — Telephone Encounter (Signed)
Copied from [place] [id number]. Topic: Quick Communication - Rx Refill/Question >> Jul 14, 2017  5:00 PM Waylan Rocher, [place] L wrote: Medication: would like something for pain  Has the patient contacted their pharmacy? No. (Agent: If no, request that the patient contact the pharmacy for the refill.) (Agent: If yes, when and what did the pharmacy advise?)  Preferred Pharmacy (with phone number or street name): [facility], [place] [address] Phone: [phone] Fax: [phone]    Agent: Please be advised that RX refills may take up to 3 business days. We ask that you follow-up with your pharmacy.

## 2017-07-15 NOTE — Telephone Encounter (Signed)
I called the phone # listed for pt and was advised had the wrong number; the person that answered phone did not know a Dion Body. Cell # rings x 1 then rings busy and unable to reach pts son. [facility] [address] has not opened. Not sure why pt needs pain med.

## 2017-07-19 ENCOUNTER — Ambulatory Visit (INDEPENDENT_AMBULATORY_CARE_PROVIDER_SITE_OTHER): Payer: PPO | Admitting: Internal Medicine

## 2017-07-19 ENCOUNTER — Encounter: Payer: Self-pay | Admitting: Internal Medicine

## 2017-07-19 VITALS — BP 156/98 | HR 100 | Wt 172.0 lb

## 2017-07-19 DIAGNOSIS — M7052 Other bursitis of knee, left knee: Secondary | ICD-10-CM | POA: Diagnosis not present

## 2017-07-19 MED ORDER — PREDNISONE 10 MG PO TABS: ORAL_TABLET | ORAL | 0 refills | Status: DC
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
## 2017-07-19 NOTE — Patient Instructions (Signed)
Pes Anserine Bursitis  The pes anserine is an area on the inside of your knee, just below the joint, which is cushioned by a fluid-filled sac (bursa). Pes anserine bursitis is a condition that happens when this bursa gets swollen and irritated. The condition causes knee pain.  What are the causes?  This condition may be caused by:  · Making the same movement over and over.  · A hit to the inside of the leg.    What increases the risk?  This injury is most likely to develop in:  · Runners.  · Athletes who play sports that involve a lot of running and quick side-to-side movements (cutting).  · Athletes who play contact sports.  · People who swim using an inward angle of the knee, such as with the breaststroke.  · People with tight hamstring muscles.  · Females.  · People who are overweight.  · People with flat feet.  · People who have diabetes or osteoarthritis.    What are the signs or symptoms?  Symptoms of this condition include:  · Knee pain that gets better with rest and worse with activities like climbing stairs, walking, running, or getting in and out of a chair (common).  · Swelling.  · Warmth.  · Tenderness when pressing at the inside of the lower leg, just below the knee.    How is this diagnosed?  This condition may be diagnosed based on:  · Your symptoms.  · Your medical history.  · A physical exam.  · Tests, such as:  ? X-rays.  ? MRI and ultrasound. These tests are done to check for swelling and fluid buildup in the bursa and to look at muscles and tendons.    During your physical exam, your health care provider will press on the tendon attachment to see if you feel pain. He or she may also check your hip and knee motion and strength.  How is this treated?  This condition may be treated by:  · Resting your knee.  · Avoiding activities that cause pain.  · Icing the inside of your knee.  · Raising (elevating) your knee while resting.  · Sleeping with a pillow between your knees. This will cushion your  injured knee.  · Taking medicine to reduce pain and swelling.  · Having medicines injected into your knee.  · Doing strengthening and stretching exercises (physical therapy).    If these treatments do not work or if the condition keeps coming back, you may need to have surgery to remove the bursa.  Follow these instructions at home:  Managing pain, stiffness, and swelling  · If directed, apply ice to your knee.  ? Put ice in a plastic bag.  ? Place a towel between your skin and the bag.  ? Leave the ice on for 20 minutes, 2-3 times a day.  · While you are sitting, elevate your knee.  · While you are lying down, elevate your knee above the level of your heart.  · Take over-the-counter and prescription medicines only as told by your health care provider.  Activity  · Return to your normal activities as told by your health care provider. Ask your health care provider what activities are safe for you.  · Do exercises as told by your health care provider.  General instructions  · Sleep with a pillow between your knees.  · Do not use any tobacco products, such as cigarettes, chewing tobacco, and e-cigarettes. Tobacco can   delay healing. If you need help quitting, ask your health care provider.  · Keep all follow-up visits as told by your health care provider. This is important.  How is this prevented?  · Warm up and stretch before being active.  · Cool down and stretch after being active.  · Give your body time to rest between periods of activity.  · Make sure to use equipment that fits you.  · Be safe and responsible while being active to avoid falls.  · Do at least 150 minutes of moderate-intensity exercise each week, such as brisk walking or water aerobics.  · Maintain physical fitness, including:  ? Strength.  ? Flexibility.  ? Cardiovascular fitness.  ? Endurance.  Contact a health care provider if:  · Your symptoms do not improve.  · Your symptoms get worse.  This information is not intended to replace advice given  to you by your health care provider. Make sure you discuss any questions you have with your health care provider.  Document Released: 12/22/2004 Document Revised: 08/27/2015 Document Reviewed: 12/07/2014  Elsevier Interactive Patient Education © 2018 [employer]

## 2017-08-09 ENCOUNTER — Other Ambulatory Visit: Payer: Self-pay | Admitting: Internal Medicine

## 2017-08-10 ENCOUNTER — Encounter (INDEPENDENT_AMBULATORY_CARE_PROVIDER_SITE_OTHER): Payer: Self-pay

## 2017-08-19 ENCOUNTER — Ambulatory Visit: Payer: Self-pay | Admitting: Internal Medicine

## 2017-09-28 ENCOUNTER — Other Ambulatory Visit: Payer: Self-pay | Admitting: Internal Medicine

## 2018-08-04 ENCOUNTER — Other Ambulatory Visit: Payer: Self-pay

## 2020-04-03 ENCOUNTER — Ambulatory Visit (INDEPENDENT_AMBULATORY_CARE_PROVIDER_SITE_OTHER)
Admission: RE | Admit: 2020-04-03 | Discharge: 2020-04-03 | Disposition: A | Discharge status: Home / Self Care | Payer: PPO | Source: Ambulatory Visit | Attending: Internal Medicine | Admitting: Internal Medicine

## 2020-04-03 ENCOUNTER — Other Ambulatory Visit: Payer: Self-pay

## 2020-04-03 ENCOUNTER — Encounter: Payer: Self-pay | Admitting: Internal Medicine

## 2020-04-03 ENCOUNTER — Ambulatory Visit (INDEPENDENT_AMBULATORY_CARE_PROVIDER_SITE_OTHER): Payer: PPO | Admitting: Internal Medicine

## 2020-04-03 VITALS — BP 142/84 | HR 98 | Temp 98.1°F | Wt 169.0 lb

## 2020-04-03 DIAGNOSIS — M25561 Pain in right knee: Secondary | ICD-10-CM

## 2020-04-03 MED ORDER — DEXAMETHASONE SODIUM PHOSPHATE 100 MG/10ML IJ SOLN
10.0000 mg | Freq: Once | INTRAMUSCULAR | Status: AC
  Administered 2020-04-03: 10 mg via INTRAMUSCULAR

## 2020-04-03 NOTE — Addendum Note (Signed)
Addended by: Lurlean Nanny on: 04/03/2020 05:34 PM   Modules accepted: Orders

## 2020-04-03 NOTE — Progress Notes (Signed)
Subjective:    Patient ID: Bruce Lara, male    DOB: 13-Oct-1950, 70 y.o.   MRN: [id number]  HPI  Pt presents to the clinic today with c/o right knee pain. He reports this started 2-3 weeks ago. He describes the pain as sharp, worse with weight bearing. He denies numbness, tingling but does report some weakness. He reports he has had to crawl up the steps to get into his house due to the knee pain. He has not noticed any swelling and denies any injury to the area. He has tried Naproxen, BC Powder and heat with no relief of symptoms.  Review of Systems      Past Medical History:  Diagnosis Date  . Alcohol abuse   . Allergy   . Anxiety   . Depression   . GERD (gastroesophageal reflux disease)   . Hyperlipidemia   . Hypertension     Current Outpatient Medications  Medication Sig Dispense Refill  . aspirin 325 MG tablet Take 325 mg by mouth daily.     No current facility-administered medications for this visit.    No Known Allergies  Family History  Problem Relation Age of Onset  . Alcohol abuse Father   . Hypertension Father   . Colon cancer Neg Hx   . Esophageal cancer Neg Hx   . Liver cancer Neg Hx   . Pancreatic cancer Neg Hx   . Rectal cancer Neg Hx   . Stomach cancer Neg Hx     Social History   Socioeconomic History  . Marital status: Married    Spouse name: Not on file  . Number of children: Not on file  . Years of education: Not on file  . Highest education level: Not on file  Occupational History  . Not on file  Tobacco Use  . Smoking status: Former Research scientist (life sciences)  . Smokeless tobacco: Never Used  . Tobacco comment: quit 2015 smoke 30 years  Substance and Sexual Activity  . Alcohol use: Yes    Alcohol/week: 3.0 standard drinks    Types: 3 Cans of beer per week    Comment: daily---40 oz daily  . Drug use: No  . Sexual activity: Not Currently  Other Topics Concern  . Not on file  Social History Narrative  . Not on file   Social Determinants of  Health   Financial Resource Strain: Not on file  Food Insecurity: Not on file  Transportation Needs: Not on file  Physical Activity: Not on file  Stress: Not on file  Social Connections: Not on file  Intimate Partner Violence: Not on file     Constitutional: Denies fever, malaise, fatigue, headache or abrupt weight changes.  Respiratory: Denies difficulty breathing, shortness of breath, cough or sputum production.   Cardiovascular: Denies chest pain, chest tightness, palpitations or swelling in the hands or feet.  Musculoskeletal: Pt reports right knee pain, difficulty with gait. Denies decrease in range of motion, muscle pain or joint swelling.  Neurological: Pt reports weakness, difficulty with balance due to pain. Denies dizziness, difficulty with memory, difficulty with speech or problems with coordination.    No other specific complaints in a complete review of systems (except as listed in HPI above).  Objective:   Physical Exam   BP (!) 142/84   Pulse 98   Temp 98.1 F (36.7 C) (Temporal)   SpO2 97%  Wt Readings from Last 3 Encounters:  07/19/17 172 lb (78 kg)  04/27/17 172 lb (78 kg)  04/22/17 170 lb (77.1 kg)    General: Appears his stated age, well developed, well nourished in NAD. Skin: Warm, dry and intact. No redness or warmth noted. Cardiovascular: Normal rate. Pulmonary/Chest: Normal effort. Musculoskeletal: Crepitus noted with flexion and extension of the right knee. Joint enlargement without swelling noted. Pain with palpation along the lateral joint line. Positive McMurray. Negative Lachmans. Strength 4/5 RLE, 4/5 LLE. Limping gait with use of cane.  Neurological: Alert and oriented. HOH. Sensation intact to BLE.   BMET    Component Value Date/Time   NA 139 04/22/2017 1331   K 5.4 (H) 04/22/2017 1331   CL 106 04/22/2017 1331   CO2 19 (L) 04/22/2017 1049   GLUCOSE 110 (H) 04/22/2017 1331   BUN 8 04/22/2017 1331   CREATININE 1.30 (H) 04/22/2017 1331    CREATININE 1.52 (H) 01/01/2017 1547   CALCIUM 9.1 04/22/2017 1049   GFRNONAA 56 (L) 04/22/2017 1049   GFRAA >60 04/22/2017 1049    Lipid Panel     Component Value Date/Time   CHOL 166 01/25/2017 1304   TRIG 156.0 (H) 01/25/2017 1304   HDL 58.00 01/25/2017 1304   CHOLHDL 3 01/25/2017 1304   VLDL 31.2 01/25/2017 1304   LDLCALC 77 01/25/2017 1304   LDLCALC 141 (H) 01/01/2017 1547    CBC    Component Value Date/Time   WBC 11.8 (H) 04/22/2017 1049   RBC 4.81 04/22/2017 1049   HGB 15.0 04/22/2017 1331   HCT 44.0 04/22/2017 1331   PLT 332 04/22/2017 1049   MCV 98.3 04/22/2017 1049   MCH 32.8 04/22/2017 1049   MCHC 33.4 04/22/2017 1049   RDW 13.5 04/22/2017 1049   LYMPHSABS 5.0 (H) 04/22/2017 1049   MONOABS 0.9 04/22/2017 1049   EOSABS 0.2 04/22/2017 1049   BASOSABS 0.1 04/22/2017 1049    Hgb A1C Lab Results  Component Value Date   HGBA1C 5.4 01/01/2017           Assessment & Plan:   Acute Right Knee Pain:  Xray right knee today Decadron 10 mg IM x 1 Encouraged ice instead of heat Stretching exercises given  Will follow up after xray, return precautions discussed Webb Silversmith, NP This visit occurred during the SARS-CoV-2 public health emergency.  Safety protocols were in place, including screening questions prior to the visit, additional usage of staff PPE, and extensive cleaning of exam room while observing appropriate contact time as indicated for disinfecting solutions.

## 2020-04-03 NOTE — Patient Instructions (Signed)
Journal for Nurse Practitioners, 15(4), 263-267. Retrieved October 11, 2017 from http://clinicalkey.com/nursing">  Knee Exercises Ask your health care provider which exercises are safe for you. Do exercises exactly as told by your health care provider and adjust them as directed. It is normal to feel mild stretching, pulling, tightness, or discomfort as you do these exercises. Stop right away if you feel sudden pain or your pain gets worse. Do not begin these exercises until told by your health care provider. Stretching and range-of-motion exercises These exercises warm up your muscles and joints and improve the movement and flexibility of your knee. These exercises also help to relieve pain and swelling. Knee extension, prone 1. Lie on your abdomen (prone position) on a bed. 2. Place your left / right knee just beyond the edge of the surface so your knee is not on the bed. You can put a towel under your left / right thigh just above your kneecap for comfort. 3. Relax your leg muscles and allow gravity to straighten your knee (extension). You should feel a stretch behind your left / right knee. 4. Hold this position for __________ seconds. 5. Scoot up so your knee is supported between repetitions. Repeat __________ times. Complete this exercise __________ times a day. Knee flexion, active 1. Lie on your back with both legs straight. If this causes back discomfort, bend your left / right knee so your foot is flat on the floor. 2. Slowly slide your left / right heel back toward your buttocks. Stop when you feel a gentle stretch in the front of your knee or thigh (flexion). 3. Hold this position for __________ seconds. 4. Slowly slide your left / right heel back to the starting position. Repeat __________ times. Complete this exercise __________ times a day.   Quadriceps stretch, prone 1. Lie on your abdomen on a firm surface, such as a bed or padded floor. 2. Bend your left / right knee and hold  your ankle. If you cannot reach your ankle or pant leg, loop a belt around your foot and grab the belt instead. 3. Gently pull your heel toward your buttocks. Your knee should not slide out to the side. You should feel a stretch in the front of your thigh and knee (quadriceps). 4. Hold this position for __________ seconds. Repeat __________ times. Complete this exercise __________ times a day.   Hamstring, supine 1. Lie on your back (supine position). 2. Loop a belt or towel over the ball of your left / right foot. The ball of your foot is on the walking surface, right under your toes. 3. Straighten your left / right knee and slowly pull on the belt to raise your leg until you feel a gentle stretch behind your knee (hamstring). ? Do not let your knee bend while you do this. ? Keep your other leg flat on the floor. 4. Hold this position for __________ seconds. Repeat __________ times. Complete this exercise __________ times a day. Strengthening exercises These exercises build strength and endurance in your knee. Endurance is the ability to use your muscles for a long time, even after they get tired. Quadriceps, isometric This exercise stretches the muscles in front of your thigh (quadriceps) without moving your knee joint (isometric). 1. Lie on your back with your left / right leg extended and your other knee bent. Put a rolled towel or small pillow under your knee if told by your health care provider. 2. Slowly tense the muscles in the front of your   left / right thigh. You should see your kneecap slide up toward your hip or see increased dimpling just above the knee. This motion will push the back of the knee toward the floor. 3. For __________ seconds, hold the muscle as tight as you can without increasing your pain. 4. Relax the muscles slowly and completely. Repeat __________ times. Complete this exercise __________ times a day.   Straight leg raises This exercise stretches the muscles in  front of your thigh (quadriceps) and the muscles that move your hips (hip flexors). 1. Lie on your back with your left / right leg extended and your other knee bent. 2. Tense the muscles in the front of your left / right thigh. You should see your kneecap slide up or see increased dimpling just above the knee. Your thigh may even shake a bit. 3. Keep these muscles tight as you raise your leg 4-6 inches (10-15 cm) off the floor. Do not let your knee bend. 4. Hold this position for __________ seconds. 5. Keep these muscles tense as you lower your leg. 6. Relax your muscles slowly and completely after each repetition. Repeat __________ times. Complete this exercise __________ times a day. Hamstring, isometric 1. Lie on your back on a firm surface. 2. Bend your left / right knee about __________ degrees. 3. Dig your left / right heel into the surface as if you are trying to pull it toward your buttocks. Tighten the muscles in the back of your thighs (hamstring) to "dig" as hard as you can without increasing any pain. 4. Hold this position for __________ seconds. 5. Release the tension gradually and allow your muscles to relax completely for __________ seconds after each repetition. Repeat __________ times. Complete this exercise __________ times a day. Hamstring curls If told by your health care provider, do this exercise while wearing ankle weights. Begin with __________ lb weights. Then increase the weight by 1 lb (0.5 kg) increments. Do not wear ankle weights that are more than __________ lb. 1. Lie on your abdomen with your legs straight. 2. Bend your left / right knee as far as you can without feeling pain. Keep your hips flat against the floor. 3. Hold this position for __________ seconds. 4. Slowly lower your leg to the starting position. Repeat __________ times. Complete this exercise __________ times a day.   Squats This exercise strengthens the muscles in front of your thigh and knee  (quadriceps). 1. Stand in front of a table, with your feet and knees pointing straight ahead. You may rest your hands on the table for balance but not for support. 2. Slowly bend your knees and lower your hips like you are going to sit in a chair. ? Keep your weight over your heels, not over your toes. ? Keep your lower legs upright so they are parallel with the table legs. ? Do not let your hips go lower than your knees. ? Do not bend lower than told by your health care provider. ? If your knee pain increases, do not bend as low. 3. Hold the squat position for __________ seconds. 4. Slowly push with your legs to return to standing. Do not use your hands to pull yourself to standing. Repeat __________ times. Complete this exercise __________ times a day. Wall slides This exercise strengthens the muscles in front of your thigh and knee (quadriceps). 1. Lean your back against a smooth wall or door, and walk your feet out 18-24 inches (46-61 cm) from it. 2.   Place your feet hip-width apart. 3. Slowly slide down the wall or door until your knees bend __________ degrees. Keep your knees over your heels, not over your toes. Keep your knees in line with your hips. 4. Hold this position for __________ seconds. Repeat __________ times. Complete this exercise __________ times a day.   Straight leg raises This exercise strengthens the muscles that rotate the leg at the hip and move it away from your body (hip abductors). 1. Lie on your side with your left / right leg in the top position. Lie so your head, shoulder, knee, and hip line up. You may bend your bottom knee to help you keep your balance. 2. Roll your hips slightly forward so your hips are stacked directly over each other and your left / right knee is facing forward. 3. Leading with your heel, lift your top leg 4-6 inches (10-15 cm). You should feel the muscles in your outer hip lifting. ? Do not let your foot drift forward. ? Do not let your  knee roll toward the ceiling. 4. Hold this position for __________ seconds. 5. Slowly return your leg to the starting position. 6. Let your muscles relax completely after each repetition. Repeat __________ times. Complete this exercise __________ times a day.   Straight leg raises This exercise stretches the muscles that move your hips away from the front of the pelvis (hip extensors). 1. Lie on your abdomen on a firm surface. You can put a pillow under your hips if that is more comfortable. 2. Tense the muscles in your buttocks and lift your left / right leg about 4-6 inches (10-15 cm). Keep your knee straight as you lift your leg. 3. Hold this position for __________ seconds. 4. Slowly lower your leg to the starting position. 5. Let your leg relax completely after each repetition. Repeat __________ times. Complete this exercise __________ times a day. This information is not intended to replace advice given to you by your health care provider. Make sure you discuss any questions you have with your health care provider. Document Revised: 10/12/2017 Document Reviewed: 10/12/2017 Elsevier Patient Education  2021 Elsevier Inc.  

## 2020-04-08 ENCOUNTER — Telehealth: Payer: Self-pay | Admitting: Internal Medicine

## 2020-04-08 NOTE — Telephone Encounter (Signed)
Donnavin called in wanted to know about the results for x-ray and wanted to know about getting something for pain so he can put some pressure on it.   Please advise

## 2020-04-09 NOTE — Telephone Encounter (Signed)
Returning the phone call

## 2020-04-11 ENCOUNTER — Other Ambulatory Visit: Payer: Self-pay | Admitting: Internal Medicine

## 2020-04-11 DIAGNOSIS — M25561 Pain in right knee: Secondary | ICD-10-CM

## 2020-04-11 MED ORDER — TRAMADOL HCL 50 MG PO TABS: 50.0000 mg | ORAL_TABLET | Freq: Three times a day (TID) | ORAL | 0 refills | Status: AC | PRN

## 2020-08-18 ENCOUNTER — Encounter: Payer: Self-pay | Admitting: Internal Medicine

## 2021-02-06 ENCOUNTER — Telehealth: Payer: Self-pay

## 2021-02-10 NOTE — Telephone Encounter (Signed)
Open in error

## 2021-03-04 ENCOUNTER — Telehealth: Payer: Self-pay

## 2021-03-04 NOTE — Telephone Encounter (Signed)
I attempted to contact the patient to schedule the Medicare Annual Wellness Visit (AWV) virtually, telephone or face to face. No answer or voicemail.  [phone]

## 2021-03-07 ENCOUNTER — Telehealth: Payer: Self-pay | Admitting: Radiology

## 2021-03-07 ENCOUNTER — Other Ambulatory Visit: Payer: Self-pay

## 2021-03-07 ENCOUNTER — Encounter: Payer: Self-pay | Admitting: Family

## 2021-03-07 ENCOUNTER — Ambulatory Visit (INDEPENDENT_AMBULATORY_CARE_PROVIDER_SITE_OTHER): Payer: PPO | Admitting: Family

## 2021-03-07 VITALS — BP 148/88 | HR 85 | Ht 66.0 in | Wt 166.0 lb

## 2021-03-07 DIAGNOSIS — M6281 Muscle weakness (generalized): Secondary | ICD-10-CM | POA: Diagnosis not present

## 2021-03-07 DIAGNOSIS — F1010 Alcohol abuse, uncomplicated: Secondary | ICD-10-CM

## 2021-03-07 DIAGNOSIS — F17200 Nicotine dependence, unspecified, uncomplicated: Secondary | ICD-10-CM | POA: Diagnosis not present

## 2021-03-07 DIAGNOSIS — I10 Essential (primary) hypertension: Secondary | ICD-10-CM | POA: Diagnosis not present

## 2021-03-07 DIAGNOSIS — R29898 Other symptoms and signs involving the musculoskeletal system: Secondary | ICD-10-CM

## 2021-03-07 DIAGNOSIS — R7989 Other specified abnormal findings of blood chemistry: Secondary | ICD-10-CM

## 2021-03-07 DIAGNOSIS — E876 Hypokalemia: Secondary | ICD-10-CM | POA: Diagnosis not present

## 2021-03-07 DIAGNOSIS — R0609 Other forms of dyspnea: Secondary | ICD-10-CM | POA: Diagnosis not present

## 2021-03-07 DIAGNOSIS — E782 Mixed hyperlipidemia: Secondary | ICD-10-CM | POA: Diagnosis not present

## 2021-03-07 DIAGNOSIS — R739 Hyperglycemia, unspecified: Secondary | ICD-10-CM

## 2021-03-07 DIAGNOSIS — R531 Weakness: Secondary | ICD-10-CM | POA: Diagnosis not present

## 2021-03-07 LAB — COMPREHENSIVE METABOLIC PANEL
ALT: 17 U/L (ref 0–53)
AST: 31 U/L (ref 0–37)
Albumin: 4.2 g/dL (ref 3.5–5.2)
Alkaline Phosphatase: 119 U/L — ABNORMAL HIGH (ref 39–117)
BUN: 10 mg/dL (ref 6–23)
CO2: 26 mEq/L (ref 19–32)
Calcium: 9.4 mg/dL (ref 8.4–10.5)
Chloride: 102 mEq/L (ref 96–112)
Creatinine, Ser: 0.97 mg/dL (ref 0.40–1.50)
GFR: 80.07 mL/min (ref >60.00)
Glucose, Bld: 95 mg/dL (ref 70–99)
Potassium: 4.3 mEq/L (ref 3.5–5.1)
Sodium: 136 mEq/L (ref 135–145)
Total Bilirubin: 1.5 mg/dL — ABNORMAL HIGH (ref 0.2–1.2)
Total Protein: 7.6 g/dL (ref 6.0–8.3)

## 2021-03-07 LAB — LIPID PANEL
Cholesterol: 222 mg/dL — ABNORMAL HIGH (ref 0–200)
HDL: 68.9 mg/dL (ref >39.00)
LDL Cholesterol: 132 mg/dL — ABNORMAL HIGH (ref 0–99)
NonHDL: 152.81
Total CHOL/HDL Ratio: 3
Triglycerides: 104 mg/dL (ref 0.0–149.0)
VLDL: 20.8 mg/dL (ref 0.0–40.0)

## 2021-03-07 LAB — B12 AND FOLATE PANEL
Folate: 13.2 ng/mL (ref >5.9)
Vitamin B-12: 247 pg/mL (ref 211–911)

## 2021-03-07 LAB — CBC
HCT: 52 % (ref 39.0–52.0)
Hemoglobin: 18 g/dL (ref 13.0–17.0)
MCHC: 34.6 g/dL (ref 30.0–36.0)
MCV: 99.6 fl (ref 78.0–100.0)
Platelets: 218 10*3/uL (ref 150.0–400.0)
RBC: 5.22 Mil/uL (ref 4.22–5.81)
RDW: 13.3 % (ref 11.5–15.5)
WBC: 7.1 10*3/uL (ref 4.0–10.5)

## 2021-03-07 LAB — HEMOGLOBIN A1C: Hgb A1c MFr Bld: 5.2 % (ref 4.6–6.5)

## 2021-03-07 MED ORDER — AMLODIPINE BESYLATE 5 MG PO TABS: 5.0000 mg | ORAL_TABLET | Freq: Every day | ORAL | 1 refills | Status: DC

## 2021-03-07 MED ORDER — SIMVASTATIN 20 MG PO TABS: 20.0000 mg | ORAL_TABLET | Freq: Every day | ORAL | 1 refills | Status: DC

## 2021-03-07 NOTE — Assessment & Plan Note (Signed)
Patient with daily beer intake this could be contributing to his blood pressure along with smoking puts him at increased heart disease risk. ?

## 2021-03-07 NOTE — Progress Notes (Signed)
Patient just  Established Patient Office Visit  Subjective:  Patient ID: Bruce Lara, male    DOB: 04/06/1950  Age: 71 y.o. MRN: [id number]  CC: No chief complaint on file.   HPI Bruce Lara is here for a transition of care visit, goes by [employer].   Prior provider was: Rollene Fare baity Pt is without acute concerns.   chronic concerns:  HTN: at home average 135-140/80. Denies cp palp or sob. Was on chlorthalidone in the past as well as amlodipine 10 mg but stopped and never restarted the medication.   HLD: simvastatin 20 mg however often forgets to take it.  Does not necessarily pay attention to his food intake abdomen noticed this morning with me so it this could also raise his blood pressure  Does take daily aspirin.   Seasonal allergies: takes asa daily.   Lower leg weakness: notices weakness when trying to get up from his chair and after sitting for some time.  He is often standing and sits often rather than exercising.  He is aware that he needs to get up and walk around a little bit more and is going to try to do this as well.  He does notice pain with he is not sure if he has any discoloration of his lower feet when he sits or stands because he wears socks often   Past Medical History:  Diagnosis Date   Alcohol abuse    Allergy    Anxiety    Depression    GERD (gastroesophageal reflux disease)    Hyperlipidemia    Hypertension     History reviewed. No pertinent surgical history.  Family History  Problem Relation Age of Onset   Alcohol abuse Father    Hypertension Father    Colon cancer Neg Hx    Esophageal cancer Neg Hx    Liver cancer Neg Hx    Pancreatic cancer Neg Hx    Rectal cancer Neg Hx    Stomach cancer Neg Hx     Social History   Socioeconomic History   Marital status: Widowed    Spouse name: Not on file   Number of children: Not on file   Years of education: Not on file   Highest education level: Not on file  Occupational History   Not on file   Tobacco Use   Smoking status: Every Day    Packs/day: 1.00    Years: 30.00    Pack years: 30.00    Types: Cigarettes   Smokeless tobacco: Never   Tobacco comments:    smoke 30 years  Substance and Sexual Activity   Alcohol use: Yes    Alcohol/week: 3.0 standard drinks    Types: 3 Cans of beer per week    Comment: daily---40 oz daily   Drug use: No   Sexual activity: Not Currently    Partners: Female  Other Topics Concern   Not on file  Social History Narrative   Not on file   Social Determinants of Health   Financial Resource Strain: Not on file  Food Insecurity: Not on file  Transportation Needs: Not on file  Physical Activity: Not on file  Stress: Not on file  Social Connections: Not on file  Intimate Partner Violence: Not on file    Outpatient Medications Prior to Visit  Medication Sig Dispense Refill   aspirin 325 MG tablet Take 325 mg by mouth daily.     No facility-administered medications prior to visit.  No Known Allergies  ROS Review of Systems  Constitutional:  Negative for chills, fatigue, fever and unexpected weight change.  Eyes:  Negative for visual disturbance.  Respiratory:  Positive for shortness of breath (with exertion). Negative for cough.   Cardiovascular:  Negative for chest pain.  Gastrointestinal:  Negative for abdominal pain.  Genitourinary:  Negative for difficulty urinating.  Musculoskeletal:  Positive for arthralgias (lower leg pain with walking relieved by rest).  Skin:  Negative for rash.  Neurological:  Positive for weakness (bil lower legs). Negative for dizziness and headaches.  Psychiatric/Behavioral:  Negative for suicidal ideas. The patient is not nervous/anxious.        Objective:    Physical Exam Constitutional:      General: He is not in acute distress.    Appearance: Normal appearance. He is normal weight. He is not ill-appearing, toxic-appearing or diaphoretic.  HENT:     Head: Normocephalic.     Right Ear:  Tympanic membrane normal.     Left Ear: Tympanic membrane normal.     Nose: Nose normal.     Mouth/Throat:     Mouth: Mucous membranes are moist.  Eyes:     Pupils: Pupils are equal, round, and reactive to light.  Cardiovascular:     Rate and Rhythm: Normal rate and regular rhythm.     Pulses:          Carotid pulses are 2+ on the right side and 2+ on the left side.      Radial pulses are 2+ on the right side and 2+ on the left side.       Femoral pulses are 2+ on the right side and 2+ on the left side.      Popliteal pulses are 2+ on the right side and 2+ on the left side.       Dorsalis pedis pulses are 2+ on the right side and 2+ on the left side.       Posterior tibial pulses are 1+ on the right side and 2+ on the left side.     Heart sounds: Normal heart sounds.  Pulmonary:     Effort: Pulmonary effort is normal.     Breath sounds: Normal breath sounds.  Abdominal:     General: Abdomen is flat.  Musculoskeletal:        General: No tenderness, deformity or signs of injury.     Right lower leg: 1+ Pitting Edema present.     Left lower leg: 1+ Pitting Edema present.  Skin:    Coloration: Skin is pale (bil lower extremities colder to touch).  Neurological:     General: No focal deficit present.     Mental Status: He is alert and oriented to person, place, and time.  Psychiatric:        Mood and Affect: Mood normal.        Behavior: Behavior normal.        Thought Content: Thought content normal.        Judgment: Judgment normal.    BP (!) 160/88    Pulse 85    Ht 5\' 6"  (1.676 m)    Wt 166 lb (75.3 kg)    SpO2 98%    BMI 26.79 kg/m  Wt Readings from Last 3 Encounters:  03/07/21 166 lb (75.3 kg)  04/03/20 169 lb (76.7 kg)  07/19/17 172 lb (78 kg)     Health Maintenance Due  Topic Date Due   COVID-19 Vaccine (  1) Never done   Zoster Vaccines- Shingrix (1 of 2) Never done   Pneumonia Vaccine 9+ Years old (2 - PCV) 02/07/2014   COLONOSCOPY (Pts 45-42yrs Insurance  coverage will need to be confirmed)  03/09/2020   INFLUENZA VACCINE  08/05/2020    There are no preventive care reminders to display for this patient.  No results found for: TSH Lab Results  Component Value Date   WBC 11.8 (H) 04/22/2017   HGB 15.0 04/22/2017   HCT 44.0 04/22/2017   MCV 98.3 04/22/2017   PLT 332 04/22/2017   Lab Results  Component Value Date   NA 139 04/22/2017   K 5.4 (H) 04/22/2017   CO2 19 (L) 04/22/2017   GLUCOSE 110 (H) 04/22/2017   BUN 8 04/22/2017   CREATININE 1.30 (H) 04/22/2017   BILITOT 0.7 01/25/2017   ALKPHOS 63 01/25/2017   AST 42 (H) 01/25/2017   ALT 34 01/25/2017   PROT 7.6 01/25/2017   ALBUMIN 4.4 01/25/2017   CALCIUM 9.1 04/22/2017   ANIONGAP 11 04/22/2017   GFR 50.73 (L) 03/19/2017   Lab Results  Component Value Date   CHOL 166 01/25/2017   Lab Results  Component Value Date   HDL 58.00 01/25/2017   Lab Results  Component Value Date   LDLCALC 77 01/25/2017   Lab Results  Component Value Date   TRIG 156.0 (H) 01/25/2017   Lab Results  Component Value Date   CHOLHDL 3 01/25/2017   Lab Results  Component Value Date   HGBA1C 5.4 01/01/2017      Assessment & Plan:   Problem List Items Addressed This Visit       Cardiovascular and Mediastinum   Essential hypertension    Elevation of blood pressure in the office will restart on amlodipine but at 5 mg once daily will not restart chlorthalidone for now.  Patient to check his blood pressure daily and follow-up in the next [redacted] weeks along with blood pressure log in hand so we can review it.  Low-sodium diet elevate legs at night especially since there was some lower extremity edema      Relevant Medications   simvastatin (ZOCOR) 20 MG tablet   amLODipine (NORVASC) 5 MG tablet     Nervous and Auditory   Weakness of both lower extremities    Decreased pulse on right lower extremity in the posterior medial area.  Sending patient to vascular for evaluation and treatment as  patient also chronic smoker,.  Increased risk for particular arterial disease      Relevant Orders   Ambulatory referral to Vascular Surgery   B12 and Folate Panel   Vitamin B6     Other   Alcohol abuse    Patient with daily beer intake this could be contributing to his blood pressure along with smoking puts him at increased heart disease risk.      Relevant Orders   B12 and Folate Panel   Vitamin B6   HLD (hyperlipidemia) - Primary    Sending a new prescription for simvastatin advised patient to continue daily work on a low-cholesterol diet and begin to exercise as tolerated      Relevant Medications   simvastatin (ZOCOR) 20 MG tablet   amLODipine (NORVASC) 5 MG tablet   Other Relevant Orders   Lipid panel   Elevated liver function tests    We will repeat CMP today pending results could be result of patient's alcohol intake      Relevant Orders  Comprehensive metabolic panel   Hypokalemia    Repeat potassium today pending results      Relevant Orders   Comprehensive metabolic panel   Hyperglycemia    A1c today pending results      Relevant Orders   CBC   Hemoglobin A1c   Tobacco use disorder    Referring patient to lung screening center referral in place. Discussed with patient that this could be contributing to you circulatory concerns as well and discussed sensation patient is not ready to quit right now      Relevant Orders   Ambulatory Referral [facility]   CBC   Ambulatory referral to Vascular Surgery   DOE (dyspnea on exertion)    Patient would like an activity and also chronic smoker.  Sending patient to lung cancer screening clinic as well as definitely necessary for low-dose CT scan.  Lungs clear on auscultation      Relevant Orders   Ambulatory Referral Lung Cancer Screening [facility]    Meds ordered this encounter  Medications   simvastatin (ZOCOR) 20 MG tablet    Sig: Take 1 tablet (20 mg total) by  mouth at bedtime.    Dispense:  90 tablet    Refill:  1    Order Specific Question:   Supervising Provider    Answer:   BEDSOLE, AMY E [[id number]]   amLODipine (NORVASC) 5 MG tablet    Sig: Take 1 tablet (5 mg total) by mouth daily.    Dispense:  90 tablet    Refill:  1    Order Specific Question:   Supervising Provider    Answer:   Diona Browner, AMY E [[id number]]    Follow-up: Return in about 3 weeks (around 03/28/2021) for follow up on blood pressure.    Eugenia Pancoast, FNP

## 2021-03-07 NOTE — Assessment & Plan Note (Signed)
Decreased pulse on right lower extremity in the posterior medial area.  Sending patient to vascular for evaluation and treatment as patient also chronic smoker,.  Increased risk for particular arterial disease ?

## 2021-03-07 NOTE — Assessment & Plan Note (Signed)
Repeat potassium today pending results.   

## 2021-03-07 NOTE — Assessment & Plan Note (Signed)
A1c today pending results 

## 2021-03-07 NOTE — Assessment & Plan Note (Signed)
Elevation of blood pressure in the office will restart on amlodipine but at 5 mg once daily will not restart chlorthalidone for now.  Patient to check his blood pressure daily and follow-up in the next [redacted] weeks along with blood pressure log in hand so we can review it.  Low-sodium diet elevate legs at night especially since there was some lower extremity edema ?

## 2021-03-07 NOTE — Assessment & Plan Note (Signed)
Sending a new prescription for simvastatin advised patient to continue daily work on a low-cholesterol diet and begin to exercise as tolerated ?

## 2021-03-07 NOTE — Telephone Encounter (Signed)
Received, will pend rest of cbc ?Pt with no edema/ homans sign/ pain out of nowhere or redness/ no concern of blood clot ?Pt is a heavy smoker.  ?Did have DOE ?Will evaluate once rest received. ?

## 2021-03-07 NOTE — Patient Instructions (Addendum)
Stop by the lab prior to leaving today. I will notify you of your results once received.  ? ?Start monitoring your blood pressure daily, around the same time of day, for the next 2-3 weeks.  Ensure that you have rested for 30 minutes prior to checking your blood pressure. Record your readings and bring them to your next visit. ? ?A referral was placed today to the lung screening clinic as you are a smoker as well as referral to vascular for pain in lower legs.  ?Please let us know if you have not heard back within 1 week about your referral. ? ?It was a pleasure seeing you today! Please do not hesitate to reach out with any questions and or concerns. ? ?Regards,  ? ?Kamaree Wheatley ?FNP-C ? ? ?

## 2021-03-07 NOTE — Telephone Encounter (Signed)
[facility] called a critical result, HGB 18.0, results given to [employer] ?

## 2021-03-07 NOTE — Assessment & Plan Note (Signed)
Referring patient to lung screening center referral in place. ?Discussed with patient that this could be contributing to you circulatory concerns as well and discussed sensation patient is not ready to quit right now ?

## 2021-03-07 NOTE — Assessment & Plan Note (Signed)
Patient would like an activity and also chronic smoker.  Sending patient to lung cancer screening clinic as well as definitely necessary for low-dose CT scan.  Lungs clear on auscultation ?

## 2021-03-07 NOTE — Assessment & Plan Note (Signed)
We will repeat CMP today pending results could be result of patient's alcohol intake ?

## 2021-03-11 ENCOUNTER — Other Ambulatory Visit: Payer: Self-pay | Admitting: Family

## 2021-03-11 ENCOUNTER — Ambulatory Visit: Payer: PPO | Admitting: Family

## 2021-03-11 DIAGNOSIS — R0609 Other forms of dyspnea: Secondary | ICD-10-CM

## 2021-03-11 DIAGNOSIS — D582 Other hemoglobinopathies: Secondary | ICD-10-CM

## 2021-03-11 DIAGNOSIS — R29898 Other symptoms and signs involving the musculoskeletal system: Secondary | ICD-10-CM

## 2021-03-12 ENCOUNTER — Ambulatory Visit: Payer: PPO | Admitting: Family

## 2021-03-12 LAB — VITAMIN B6: Vitamin B6: 8 ng/mL (ref 2.1–21.7)

## 2021-03-13 ENCOUNTER — Ambulatory Visit: Payer: PPO | Admitting: Family

## 2021-03-13 ENCOUNTER — Encounter: Payer: Self-pay | Admitting: Family

## 2021-03-18 ENCOUNTER — Ambulatory Visit
Admission: RE | Admit: 2021-03-18 | Discharge: 2021-03-18 | Disposition: A | Discharge status: Home / Self Care | Payer: PPO | Source: Ambulatory Visit | Attending: Family | Admitting: Family

## 2021-03-18 ENCOUNTER — Encounter: Payer: Self-pay | Admitting: Family

## 2021-03-18 ENCOUNTER — Ambulatory Visit (INDEPENDENT_AMBULATORY_CARE_PROVIDER_SITE_OTHER)
Admission: RE | Admit: 2021-03-18 | Discharge: 2021-03-18 | Disposition: A | Discharge status: Home / Self Care | Payer: PPO | Source: Ambulatory Visit | Attending: Family | Admitting: Family

## 2021-03-18 ENCOUNTER — Other Ambulatory Visit: Payer: Self-pay

## 2021-03-18 ENCOUNTER — Telehealth: Payer: Self-pay

## 2021-03-18 ENCOUNTER — Ambulatory Visit (INDEPENDENT_AMBULATORY_CARE_PROVIDER_SITE_OTHER): Payer: PPO | Admitting: Family

## 2021-03-18 VITALS — BP 124/60 | HR 82 | Ht 66.0 in | Wt 173.0 lb

## 2021-03-18 DIAGNOSIS — D582 Other hemoglobinopathies: Secondary | ICD-10-CM | POA: Diagnosis not present

## 2021-03-18 DIAGNOSIS — R0609 Other forms of dyspnea: Secondary | ICD-10-CM

## 2021-03-18 DIAGNOSIS — R0602 Shortness of breath: Secondary | ICD-10-CM | POA: Diagnosis not present

## 2021-03-18 DIAGNOSIS — R29898 Other symptoms and signs involving the musculoskeletal system: Secondary | ICD-10-CM | POA: Insufficient documentation

## 2021-03-18 DIAGNOSIS — I83892 Varicose veins of left lower extremities with other complications: Secondary | ICD-10-CM

## 2021-03-18 DIAGNOSIS — R531 Weakness: Secondary | ICD-10-CM | POA: Diagnosis not present

## 2021-03-18 DIAGNOSIS — I10 Essential (primary) hypertension: Secondary | ICD-10-CM | POA: Diagnosis not present

## 2021-03-18 DIAGNOSIS — R600 Localized edema: Secondary | ICD-10-CM | POA: Diagnosis not present

## 2021-03-18 LAB — CBC
HCT: 49.4 % (ref 39.0–52.0)
Hemoglobin: 17 g/dL (ref 13.0–17.0)
MCHC: 34.5 g/dL (ref 30.0–36.0)
MCV: 100.1 fl — ABNORMAL HIGH (ref 78.0–100.0)
Platelets: 213 10*3/uL (ref 150.0–400.0)
RBC: 4.93 Mil/uL (ref 4.22–5.81)
RDW: 13 % (ref 11.5–15.5)
WBC: 8.6 10*3/uL (ref 4.0–10.5)

## 2021-03-18 LAB — BRAIN NATRIURETIC PEPTIDE: Pro B Natriuretic peptide (BNP): 105 pg/mL — ABNORMAL HIGH (ref 0.0–100.0)

## 2021-03-18 NOTE — Telephone Encounter (Signed)
Spoke with Bruce Lara. Pt to arrive at 130 pm at [facility]. Information and address given to pt. ?

## 2021-03-18 NOTE — Progress Notes (Signed)
Pro bnp mildly elevated- possible HF ?Mild pitting edema, may consider lasix ?However pending cxr

## 2021-03-18 NOTE — Patient Instructions (Addendum)
I have ordered stat ultrasounds at [facility]. Please head over there now to get done. ? ?Please call and schedule appts with the following:  ? ?Lung Screening ?[facility] ?Address: [address], [place], [place] [address] ?Phone: [phone] ? ?Vascular doctor ?[facility] ?Address: [address], [place], [place] [address] ?Phone: [phone] ? ?Complete xray(s) prior to leaving today. I will notify you of your results once received. ? ?Stop by the lab prior to leaving today. I will notify you of your results once received.  ? ?You are due for your colonoscopy. Please call Dr. Vena Rua office to schedule. [phone]  ? ?It was a pleasure seeing you today! Please do not hesitate to reach out with any questions and or concerns. ? ?Regards,  ? ?Kayvion Arneson ?FNP-C ? ? ? ?

## 2021-03-18 NOTE — Telephone Encounter (Signed)
Thanks already reviewed.  ?

## 2021-03-18 NOTE — Assessment & Plan Note (Signed)
Stat u/s venous doppler today bil to r/o dvt ?Referral in place for vascular surgeon pt to call to schedule ?D/w pt smoking cessation ?

## 2021-03-18 NOTE — Assessment & Plan Note (Addendum)
Repeat cbc today  ?Also ordering addtl testing to include JAK2, erythropoietin, BNP ?

## 2021-03-18 NOTE — Progress Notes (Signed)
? ?Established Patient Office Visit ? ?Subjective:  ?Patient ID: Bruce Lara, male    DOB: December 25, 1950  Age: 71 y.o. MRN: [id number] ? ?CC:  ?Chief Complaint  ?Patient presents with  ? Follow-up  ? ? ?HPI ?Bruce Lara is here today for follow up.  ?Pt is without acute concerns. ? ?Hemoglobin elevated last visit at 82.0. will repeat today.  ?Still with bil leg weakness, when he firsts stands up he feels the weakness. He does sit on barstool at home and watches youtube half the day, trying to get more active by going to grocery store more often then usual. No pain with dangling legs.  ? ?Shortness of breath: does notice increasing sob when walking around. Denies cough. Does still smoke, almost one pack per day. Has been smoking for about 40 years. ? ?Past Medical History:  ?Diagnosis Date  ? Alcohol abuse   ? Allergy   ? Anxiety   ? Depression   ? GERD (gastroesophageal reflux disease)   ? Hyperlipidemia   ? Hypertension   ? ? ?No past surgical history on file. ? ?Family History  ?Problem Relation Age of Onset  ? Alcohol abuse Father   ? Hypertension Father   ? Colon cancer Neg Hx   ? Esophageal cancer Neg Hx   ? Liver cancer Neg Hx   ? Pancreatic cancer Neg Hx   ? Rectal cancer Neg Hx   ? Stomach cancer Neg Hx   ? ? ?Social History  ? ?Socioeconomic History  ? Marital status: Widowed  ?  Spouse name: Not on file  ? Number of children: Not on file  ? Years of education: Not on file  ? Highest education level: Not on file  ?Occupational History  ? Not on file  ?Tobacco Use  ? Smoking status: Every Day  ?  Packs/day: 1.00  ?  Years: 30.00  ?  Pack years: 30.00  ?  Types: Cigarettes  ? Smokeless tobacco: Never  ? Tobacco comments:  ?  smoke 30 years  ?Substance and Sexual Activity  ? Alcohol use: Yes  ?  Alcohol/week: 3.0 standard drinks  ?  Types: 3 Cans of beer per week  ?  Comment: daily---40 oz daily  ? Drug use: No  ? Sexual activity: Not Currently  ?  Partners: Female  ?Other Topics Concern  ? Not on file   ?Social History Narrative  ? Not on file  ? ?Social Determinants of Health  ? ?Financial Resource Strain: Not on file  ?Food Insecurity: Not on file  ?Transportation Needs: Not on file  ?Physical Activity: Not on file  ?Stress: Not on file  ?Social Connections: Not on file  ?Intimate Partner Violence: Not on file  ? ? ?Outpatient Medications Prior to Visit  ?Medication Sig Dispense Refill  ? amLODipine (NORVASC) 5 MG tablet Take 1 tablet (5 mg total) by mouth daily. 90 tablet 1  ? aspirin 325 MG tablet Take 325 mg by mouth daily.    ? simvastatin (ZOCOR) 20 MG tablet Take 1 tablet (20 mg total) by mouth at bedtime. 90 tablet 1  ? ?No facility-administered medications prior to visit.  ? ? ?No Known Allergies ? ?ROS ?Review of Systems  ?Constitutional:  Negative for chills, fatigue and fever.  ?Respiratory:  Positive for shortness of breath (with doe) and wheezing. Negative for cough and chest tightness.   ?Cardiovascular:  Positive for leg swelling (left lower leg). Negative for chest pain and palpitations.  ?  Genitourinary:  Negative for difficulty urinating.  ?Musculoskeletal:  Negative for arthralgias.  ?Neurological:  Positive for weakness (bil le weakness upon standing after sitting for long periods). Negative for dizziness, tremors, syncope, speech difficulty and light-headedness.  ? ? ?  ?Objective:  ?  ?Physical Exam ?Constitutional:   ?   General: He is not in acute distress. ?   Appearance: Normal appearance. He is normal weight. He is not ill-appearing, toxic-appearing or diaphoretic.  ?HENT:  ?   Head: Normocephalic.  ?Cardiovascular:  ?   Rate and Rhythm: Normal rate and regular rhythm.  ?   Pulses:     ?     Dorsalis pedis pulses are 2+ on the right side and 1+ on the left side.  ?     Posterior tibial pulses are 2+ on the right side and 1+ on the left side.  ?Pulmonary:  ?   Effort: Accessory muscle usage present.  ?   Breath sounds: Decreased air movement (shallow breaths) present. No wheezing,  rhonchi or rales.  ?Musculoskeletal:  ?   Right lower leg: Edema present.  ?   Left lower leg: 2+ Edema present.  ?Skin: ?   Comments: Purple appearance to left lower extremity when hanging leg from higher chair  ?Neurological:  ?   Mental Status: He is alert.  ? ? ? ?BP 124/60   Pulse 82   Ht '5\' 6"'$  (1.676 m)   Wt 173 lb (78.5 kg)   SpO2 98%   BMI 27.92 kg/m?  ?Wt Readings from Last 3 Encounters:  ?03/18/21 173 lb (78.5 kg)  ?03/07/21 166 lb (75.3 kg)  ?04/03/20 169 lb (76.7 kg)  ? ? ? ?Health Maintenance Due  ?Topic Date Due  ? COVID-19 Vaccine (1) Never done  ? Zoster Vaccines- Shingrix (1 of 2) Never done  ? Pneumonia Vaccine 42+ Years old (2 - PCV) 02/07/2014  ? COLONOSCOPY (Pts 45-75yr Insurance coverage will need to be confirmed)  03/09/2020  ? INFLUENZA VACCINE  08/05/2020  ? ? ?There are no preventive care reminders to display for this patient. ? ?No results found for: TSH ?Lab Results  ?Component Value Date  ? WBC 7.1 03/07/2021  ? HGB 18.0 Repeated and verified X2. ([facility] 03/07/2021  ? HCT 52.0 03/07/2021  ? MCV 99.6 03/07/2021  ? PLT 218.0 03/07/2021  ? ?Lab Results  ?Component Value Date  ? NA 136 03/07/2021  ? K 4.3 03/07/2021  ? CO2 26 03/07/2021  ? GLUCOSE 95 03/07/2021  ? BUN 10 03/07/2021  ? CREATININE 0.97 03/07/2021  ? BILITOT 1.5 (H) 03/07/2021  ? ALKPHOS 119 (H) 03/07/2021  ? AST 31 03/07/2021  ? ALT 17 03/07/2021  ? PROT 7.6 03/07/2021  ? ALBUMIN 4.2 03/07/2021  ? CALCIUM 9.4 03/07/2021  ? ANIONGAP 11 04/22/2017  ? GFR 80.07 03/07/2021  ? ?Lab Results  ?Component Value Date  ? CHOL 222 (H) 03/07/2021  ? ?Lab Results  ?Component Value Date  ? HDL 68.90 03/07/2021  ? ?Lab Results  ?Component Value Date  ? LDLCALC 132 (H) 03/07/2021  ? ?Lab Results  ?Component Value Date  ? TRIG 104.0 03/07/2021  ? ?Lab Results  ?Component Value Date  ? CHOLHDL 3 03/07/2021  ? ?Lab Results  ?Component Value Date  ? HGBA1C 5.2 03/07/2021  ? ? ?  ?Assessment & Plan:  ? ?Problem List Items Addressed This Visit    ? ?  ? Cardiovascular and Mediastinum  ? Essential hypertension  ?  Blood pressure controlled today.  ?Cont amlodipine 5 mg po qd once daily ?  ?  ? 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  Stat u/s venous doppler today bil to r/o dvt ?Referral in place for vascular surgeon pt to call to schedule ?D/w pt smoking cessation ?  ?  ? Relevant Orders  ? US Venous Img Lower Bilateral (DVT)  ?  ? Nervous and Auditory  ? Right leg weakness  ?  Stat u/s venous doppler today bil to r/o dvt ?Referral in place for vascular surgeon pt to call to schedule ?D/w pt smoking cessation ? ?  ?  ? Relevant Orders  ? US Venous Img Lower Bilateral (DVT)  ? Weakness of left lower extremity  ?  Stat u/s venous doppler today bil to r/o dvt ?Referral in place for vascular surgeon pt to call to schedule ?D/w pt smoking cessation ?  ?  ? Relevant Orders  ? US Venous Img Lower Bilateral (DVT)  ?  ? Other  ? DOE (dyspnea on exertion)  ?  CXR today ?D/w pt smoking cessation ?Pt already referred to lung cancer screening, pt will call ?  ?  ? Relevant Orders  ? US Venous Img Lower Bilateral (DVT)  ? Elevated hemoglobin (HCC)  ?  Repeat cbc today  ?Also ordering addtl testing to include JAK2, erythropoietin, BNP ?  ?  ? ? ?No orders of the defined types were placed in this encounter. ? ? ?Follow-up: No follow-ups on file.  ? ? ?Eugenia Pancoast, FNP ?

## 2021-03-18 NOTE — Telephone Encounter (Signed)
Parker from [facility] called with Venous Doppler report on pt. Negative for DVT. Report in system. Advised pt could leave and will be contacted later. ?

## 2021-03-18 NOTE — Assessment & Plan Note (Signed)
CXR today ?D/w pt smoking cessation ?Pt already referred to lung cancer screening, pt will call ?

## 2021-03-18 NOTE — Assessment & Plan Note (Signed)
Stat u/s venous doppler today bil to r/o dvt ?Referral in place for vascular surgeon pt to call to schedule ?D/w pt smoking cessation ? ?

## 2021-03-18 NOTE — Assessment & Plan Note (Signed)
Blood pressure controlled today.  ?Cont amlodipine 5 mg po qd once daily ?

## 2021-03-18 NOTE — Telephone Encounter (Signed)
Ashtyn calling to speak with Tabitha about pts referral. ?

## 2021-03-20 ENCOUNTER — Other Ambulatory Visit: Payer: Self-pay

## 2021-03-21 ENCOUNTER — Encounter: Payer: Self-pay | Admitting: *Deleted

## 2021-03-21 ENCOUNTER — Other Ambulatory Visit: Payer: Self-pay | Admitting: Family

## 2021-03-21 DIAGNOSIS — R7989 Other specified abnormal findings of blood chemistry: Secondary | ICD-10-CM

## 2021-03-21 DIAGNOSIS — R0609 Other forms of dyspnea: Secondary | ICD-10-CM

## 2021-03-21 NOTE — Progress Notes (Signed)
Let pt know that I have ordered an echocardiogram, ultrasound of his heart just to see if any evidence of congestive heart failure with patients shortness of breath and slightly elevated BNP. He should hear from referrals in the next few weeks in regards to this order.

## 2021-03-24 LAB — JAK2 V617F MUTATION ANALYSIS: JAK2 V617F Mutation: NOT DETECTED

## 2021-03-24 LAB — ERYTHROPOIETIN: Erythropoietin: 7.8 m[IU]/mL (ref 2.6–18.5)

## 2021-03-25 ENCOUNTER — Other Ambulatory Visit: Payer: Self-pay

## 2021-03-25 ENCOUNTER — Ambulatory Visit (INDEPENDENT_AMBULATORY_CARE_PROVIDER_SITE_OTHER): Payer: PPO | Admitting: Nurse Practitioner

## 2021-03-25 ENCOUNTER — Encounter (INDEPENDENT_AMBULATORY_CARE_PROVIDER_SITE_OTHER): Payer: Self-pay | Admitting: Nurse Practitioner

## 2021-03-25 VITALS — BP 136/70 | HR 82 | Resp 16 | Ht 66.0 in | Wt 171.8 lb

## 2021-03-25 DIAGNOSIS — I10 Essential (primary) hypertension: Secondary | ICD-10-CM | POA: Diagnosis not present

## 2021-03-25 DIAGNOSIS — R29898 Other symptoms and signs involving the musculoskeletal system: Secondary | ICD-10-CM

## 2021-03-25 NOTE — Progress Notes (Signed)
? ?Subjective:  ? ? Patient ID: Bruce Lara, male    DOB: 09-May-1950, 71 y.o.   MRN: [id number] ?Chief Complaint  ?Patient presents with  ? New Patient (Initial Visit)  ?  Ref Dugal for weakness with both legs  ? ? ?Bruce Lara is a 71 year old male that is referred by Phebe Colla, FNP for lower extremity weakness.  He recently had a DVT study done which was negative.  The weakness is difficult for the patient to describe.  He notes it as more of a deconditioning and feeling winded.  He denies classic claudication-like symptoms.  He notes that he has some difficulty with walking up stairs and that he needs to hold onto the railing.  He denies any rest pain.  He denies any open wounds or ulcerations.  He does note some swelling in the ankle areas but no significant issues.  He notes that he previously had bursitis in both of his knees and during this time he can barely walk. ? ? ?Review of Systems  ?Neurological:  Positive for weakness.  ?All other systems reviewed and are negative. ? ?   ?Objective:  ? Physical Exam ?Vitals reviewed.  ?HENT:  ?   Head: Normocephalic.  ?Cardiovascular:  ?   Rate and Rhythm: Normal rate.  ?   Pulses: Normal pulses.  ?Pulmonary:  ?   Effort: Pulmonary effort is normal.  ?Skin: ?   General: Skin is warm.  ?Neurological:  ?   Mental Status: He is alert and oriented to person, place, and time.  ?Psychiatric:     ?   Mood and Affect: Mood normal.     ?   Behavior: Behavior normal.     ?   Thought Content: Thought content normal.     ?   Judgment: Judgment normal.  ? ? ?BP 136/70 (BP Location: Left Arm)   Pulse 82   Resp 16   Ht '5\' 6"'$  (1.676 m)   Wt 171 lb 12.8 oz (77.9 kg)   BMI 27.73 kg/m?  ? ?Past Medical History:  ?Diagnosis Date  ? Alcohol abuse   ? Allergy   ? Anxiety   ? Depression   ? GERD (gastroesophageal reflux disease)   ? Hyperlipidemia   ? Hypertension   ? ? ?Social History  ? ?Socioeconomic History  ? Marital status: Widowed  ?  Spouse name: Not on file  ? Number of children:  Not on file  ? Years of education: Not on file  ? Highest education level: Not on file  ?Occupational History  ? Not on file  ?Tobacco Use  ? Smoking status: Every Day  ?  Packs/day: 1.00  ?  Years: 30.00  ?  Pack years: 30.00  ?  Types: Cigarettes  ? Smokeless tobacco: Never  ? Tobacco comments:  ?  smoke 30 years  ?Substance and Sexual Activity  ? Alcohol use: Yes  ?  Alcohol/week: 3.0 standard drinks  ?  Types: 3 Cans of beer per week  ?  Comment: daily---40 oz daily  ? Drug use: No  ? Sexual activity: Not Currently  ?  Partners: Female  ?Other Topics Concern  ? Not on file  ?Social History Narrative  ? Not on file  ? ?Social Determinants of Health  ? ?Financial Resource Strain: Not on file  ?Food Insecurity: Not on file  ?Transportation Needs: Not on file  ?Physical Activity: Not on file  ?Stress: Not on file  ?Social Connections: Not on  file  ?Intimate Partner Violence: Not on file  ? ? ?History reviewed. No pertinent surgical history. ? ?Family History  ?Problem Relation Age of Onset  ? Alcohol abuse Father   ? Hypertension Father   ? Colon cancer Neg Hx   ? Esophageal cancer Neg Hx   ? Liver cancer Neg Hx   ? Pancreatic cancer Neg Hx   ? Rectal cancer Neg Hx   ? Stomach cancer Neg Hx   ? ? ?No Known Allergies ? ?CBC Latest Ref Rng & Units 03/18/2021 03/07/2021 04/22/2017  ?WBC 4.0 - 10.5 K/uL 8.6 7.1 -  ?Hemoglobin 13.0 - 17.0 g/dL 17.0 18.0 Repeated and verified X2.(HH) 15.0  ?Hematocrit 39.0 - 52.0 % 49.4 52.0 44.0  ?Platelets 150.0 - 400.0 K/uL 213.0 218.0 -  ? ? ? ? ?CMP  ?   ?Component Value Date/Time  ? NA 136 03/07/2021 1104  ? K 4.3 03/07/2021 1104  ? CL 102 03/07/2021 1104  ? CO2 26 03/07/2021 1104  ? GLUCOSE 95 03/07/2021 1104  ? BUN 10 03/07/2021 1104  ? CREATININE 0.97 03/07/2021 1104  ? CREATININE 1.52 (H) 01/01/2017 1547  ? CALCIUM 9.4 03/07/2021 1104  ? PROT 7.6 03/07/2021 1104  ? ALBUMIN 4.2 03/07/2021 1104  ? AST 31 03/07/2021 1104  ? ALT 17 03/07/2021 1104  ? ALKPHOS 119 (H) 03/07/2021 1104  ?  BILITOT 1.5 (H) 03/07/2021 1104  ? GFRNONAA 56 (L) 04/22/2017 1049  ? GFRAA >60 04/22/2017 1049  ? ? ? ?No results found. ? ?   ?Assessment & Plan:  ? ?1. Weakness of both lower extremities ?The patient does not describe typical PAD symptoms such as claudication, rest pain or ulceration.  The weakness that he describes is difficult to put into words for the patient.  Given the patient's history of smoking, hypertension and hyperlipidemia it is possible that this could be related to decreased perfusion to the lower extremities.  We will have the patient return for ABIs at his convenience. ? ?2. Essential hypertension ?Continue antihypertensive medications as already ordered, these medications have been reviewed and there are no changes at this time.  ? ? ?Current Outpatient Medications on File Prior to Visit  ?Medication Sig Dispense Refill  ? amLODipine (NORVASC) 5 MG tablet Take 1 tablet (5 mg total) by mouth daily. 90 tablet 1  ? aspirin 325 MG tablet Take 325 mg by mouth daily.    ? simvastatin (ZOCOR) 20 MG tablet Take 1 tablet (20 mg total) by mouth at bedtime. 90 tablet 1  ? ?No current facility-administered medications on file prior to visit.  ? ? ?There are no Patient Instructions on file for this visit. ?No follow-ups on file. ? ? ?Kris Hartmann, NP ? ? ?

## 2021-04-01 ENCOUNTER — Other Ambulatory Visit: Payer: Self-pay

## 2021-04-01 ENCOUNTER — Encounter: Payer: Self-pay | Admitting: Family

## 2021-04-01 ENCOUNTER — Ambulatory Visit (INDEPENDENT_AMBULATORY_CARE_PROVIDER_SITE_OTHER): Payer: PPO | Admitting: Family

## 2021-04-01 VITALS — BP 128/70 | HR 88 | Temp 98.0°F | Resp 18 | Ht 66.0 in | Wt 175.2 lb

## 2021-04-01 DIAGNOSIS — I83892 Varicose veins of left lower extremities with other complications: Secondary | ICD-10-CM | POA: Diagnosis not present

## 2021-04-01 DIAGNOSIS — I10 Essential (primary) hypertension: Secondary | ICD-10-CM | POA: Diagnosis not present

## 2021-04-01 DIAGNOSIS — R7989 Other specified abnormal findings of blood chemistry: Secondary | ICD-10-CM

## 2021-04-01 DIAGNOSIS — R0609 Other forms of dyspnea: Secondary | ICD-10-CM

## 2021-04-01 NOTE — Assessment & Plan Note (Signed)
Continue f/u with vascular surgeon and ABI as scheduled. ?

## 2021-04-01 NOTE — Progress Notes (Signed)
? ?Established Patient Office Visit ? ?Subjective:  ?Patient ID: Bruce Lara, male    DOB: [birth year]  Age: 71 y.o. MRN: [id number] ? ?CC:  ?Chief Complaint  ?Patient presents with  ? Hypertension  ? ? ?HPI ?Bruce Lara is here today for follow up.  ?Pt is without acute concerns. ? ?HTN: average at home 120/130 over 80 or so.  ?Is reducing by 5 pounds because when we checked his blood pressure cuff it was slightly elevated.  ?Still taking amlodipine 5 mg once daily.  ? ?Bil leg pain: Abi's (April 10th) ordered, seen by vascular surgeon Dr. Owens Shark. Has appt scheduled in April. Has f/u with vascular doctor April 20th as well.  ? ?SOB/DOE: has appt April 4th with pulmonary for evaluation.  ? ?Bnp was sligihtly elevated, only by about 5 points. Ordering echocardiogram as pt also with [facility].  ? ?Past Medical History:  ?Diagnosis Date  ? Alcohol abuse   ? Allergy   ? Anxiety   ? Depression   ? GERD (gastroesophageal reflux disease)   ? Hyperlipidemia   ? Hypertension   ? ? ?No past surgical history on file. ? ?Family History  ?Problem Relation Age of Onset  ? Alcohol abuse Father   ? Hypertension Father   ? Colon cancer Neg Hx   ? Esophageal cancer Neg Hx   ? Liver cancer Neg Hx   ? Pancreatic cancer Neg Hx   ? Rectal cancer Neg Hx   ? Stomach cancer Neg Hx   ? ? ?Social History  ? ?Socioeconomic History  ? Marital status: Widowed  ?  Spouse name: Not on file  ? Number of children: Not on file  ? Years of education: Not on file  ? Highest education level: Not on file  ?Occupational History  ? Not on file  ?Tobacco Use  ? Smoking status: Every Day  ?  Packs/day: 1.00  ?  Years: 30.00  ?  Pack years: 30.00  ?  Types: Cigarettes  ? Smokeless tobacco: Never  ? Tobacco comments:  ?  smoke 30 years  ?Substance and Sexual Activity  ? Alcohol use: Yes  ?  Alcohol/week: 3.0 standard drinks  ?  Types: 3 Cans of beer per week  ?  Comment: daily---40 oz daily  ? Drug use: No  ? Sexual activity: Not Currently  ?  Partners:  Female  ?Other Topics Concern  ? Not on file  ?Social History Narrative  ? Not on file  ? ?Social Determinants of Health  ? ?Financial Resource Strain: Not on file  ?Food Insecurity: Not on file  ?Transportation Needs: Not on file  ?Physical Activity: Not on file  ?Stress: Not on file  ?Social Connections: Not on file  ?Intimate Partner Violence: Not on file  ? ? ?Outpatient Medications Prior to Visit  ?Medication Sig Dispense Refill  ? amLODipine (NORVASC) 5 MG tablet Take 1 tablet (5 mg total) by mouth daily. 90 tablet 1  ? aspirin 325 MG tablet Take 325 mg by mouth daily.    ? Omega-3 Fatty Acids (FISH OIL) 1000 MG CAPS Take by mouth.    ? simvastatin (ZOCOR) 20 MG tablet Take 1 tablet (20 mg total) by mouth at bedtime. 90 tablet 1  ? ?No facility-administered medications prior to visit.  ? ? ?No Known Allergies ? ?ROS ?Review of Systems  ?Constitutional:  Negative for chills, fatigue, fever and unexpected weight change.  ?Eyes:  Negative for visual disturbance.  ?  Respiratory:  Positive for shortness of breath (doe). Negative for cough, chest tightness and wheezing.   ?Cardiovascular:  Negative for chest pain.  ?Gastrointestinal:  Negative for abdominal pain.  ?Genitourinary:  Negative for difficulty urinating.  ?Skin:  Negative for rash.  ?Neurological:  Negative for dizziness and headaches.  ? ? ? ?  ?Objective:  ?  ?Physical Exam ? ?Gen: NAD, resting comfortably  ?CV: RRR with no murmurs appreciated, no cartotidbruits ?Pulm: NWOB, CTAB with no crackles, wheezes, or rhonchi ?Skin: warm, dry ?Psych: Normal affect and thought content ? ?BP 128/70   Pulse 88   Temp 98 ?F (36.7 ?C)   Resp 18   Ht '5\' 6"'$  (1.676 m)   Wt 175 lb 4 oz (79.5 kg)   SpO2 98%   BMI 28.29 kg/m?  ?Wt Readings from Last 3 Encounters:  ?04/01/21 175 lb 4 oz (79.5 kg)  ?03/25/21 171 lb 12.8 oz (77.9 kg)  ?03/18/21 173 lb (78.5 kg)  ? ? ? ?Health Maintenance Due  ?Topic Date Due  ? COLONOSCOPY (Pts 45-65yr Insurance coverage will need to  be confirmed)  03/09/2020  ? ? ?There are no preventive care reminders to display for this patient. ? ?No results found for: TSH ?Lab Results  ?Component Value Date  ? WBC 8.6 03/18/2021  ? HGB 17.0 03/18/2021  ? HCT 49.4 03/18/2021  ? MCV 100.1 (H) 03/18/2021  ? PLT 213.0 03/18/2021  ? ?Lab Results  ?Component Value Date  ? NA 136 03/07/2021  ? K 4.3 03/07/2021  ? CO2 26 03/07/2021  ? GLUCOSE 95 03/07/2021  ? BUN 10 03/07/2021  ? CREATININE 0.97 03/07/2021  ? BILITOT 1.5 (H) 03/07/2021  ? ALKPHOS 119 (H) 03/07/2021  ? AST 31 03/07/2021  ? ALT 17 03/07/2021  ? PROT 7.6 03/07/2021  ? ALBUMIN 4.2 03/07/2021  ? CALCIUM 9.4 03/07/2021  ? ANIONGAP 11 04/22/2017  ? GFR 80.07 03/07/2021  ? ?Lab Results  ?Component Value Date  ? CHOL 222 (H) 03/07/2021  ? ?Lab Results  ?Component Value Date  ? HDL 68.90 03/07/2021  ? ?Lab Results  ?Component Value Date  ? LDLCALC 132 (H) 03/07/2021  ? ?Lab Results  ?Component Value Date  ? TRIG 104.0 03/07/2021  ? ?Lab Results  ?Component Value Date  ? CHOLHDL 3 03/07/2021  ? ?Lab Results  ?Component Value Date  ? HGBA1C 5.2 03/07/2021  ? ? ?  ?Assessment & Plan:  ? ?Problem List Items Addressed This Visit   ? ?  ? Cardiovascular and Mediastinum  ? Essential hypertension - Primary  ?  Continue amlodipine 5 mg  ?Pt advised of the following:  ?Continue medication as prescribed. Monitor blood pressure periodically and/or when you feel symptomatic. Goal is <130/90 on average. Ensure that you have rested for 30 minutes prior to checking your blood pressure. Record your readings and bring them to your next visit if necessary.work on a low sodium diet. ? ?  ?  ? 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  Continue f/u with vascular surgeon and ABI as scheduled. ?  ?  ?  ? Other  ? DOE (dyspnea on exertion)  ?  Workup with pulmonary as well as w/u to r/o CHF in place ?  ?  ? Elevated brain natriuretic peptide (BNP) level  ?  Ordered u/s /echocardiogram ?Pending apt ?  ?  ? ? ?No orders of  the defined types were placed in this encounter. ? ? ?Follow-up: Return  in about 3 months (around 07/02/2021) for regular follow up appointment.  ? ? ?Eugenia Pancoast, FNP ?

## 2021-04-01 NOTE — Assessment & Plan Note (Signed)
Workup with pulmonary as well as w/u to r/o CHF in place ?

## 2021-04-01 NOTE — Assessment & Plan Note (Signed)
Continue amlodipine 5 mg  Pt advised of the following:  Continue medication as prescribed. Monitor blood pressure periodically and/or when you feel symptomatic. Goal is <130/90 on average. Ensure that you have rested for 30 minutes prior to checking your blood pressure. Record your readings and bring them to your next visit if necessary.work on a low sodium diet.  

## 2021-04-01 NOTE — Patient Instructions (Addendum)
[facility] echo department: ?Ask welcome desk and they will direct you where to go  ?Echo scheduled  Thurs 3/30 '@11a'$   Arrive '@10'$ :45a ? ?A referral was placed today for an order in regards to an echocardiogram (ultrasound for the heart) ?Please let us know if you have not heard back within 1 week about your referral. ? ?It was a pleasure seeing you today! Please do not hesitate to reach out with any questions and or concerns. ? ?Regards,  ? ?Imanie Darrow ?FNP-C ? ? ?

## 2021-04-01 NOTE — Assessment & Plan Note (Signed)
Ordered u/s /echocardiogram ?Pending apt ?

## 2021-04-03 ENCOUNTER — Ambulatory Visit
Admission: RE | Admit: 2021-04-03 | Discharge: 2021-04-03 | Disposition: A | Discharge status: Home / Self Care | Payer: PPO | Source: Ambulatory Visit | Attending: Family | Admitting: Family

## 2021-04-03 ENCOUNTER — Telehealth: Payer: Self-pay

## 2021-04-03 DIAGNOSIS — F419 Anxiety disorder, unspecified: Secondary | ICD-10-CM | POA: Insufficient documentation

## 2021-04-03 DIAGNOSIS — E785 Hyperlipidemia, unspecified: Secondary | ICD-10-CM | POA: Insufficient documentation

## 2021-04-03 DIAGNOSIS — I10 Essential (primary) hypertension: Secondary | ICD-10-CM | POA: Diagnosis not present

## 2021-04-03 DIAGNOSIS — R7989 Other specified abnormal findings of blood chemistry: Secondary | ICD-10-CM | POA: Diagnosis not present

## 2021-04-03 DIAGNOSIS — R0609 Other forms of dyspnea: Secondary | ICD-10-CM | POA: Diagnosis not present

## 2021-04-03 DIAGNOSIS — R0600 Dyspnea, unspecified: Secondary | ICD-10-CM | POA: Diagnosis not present

## 2021-04-03 LAB — ECHOCARDIOGRAM COMPLETE
AR max vel: 2.35 cm2
AV Area VTI: 2.27 cm2
AV Area mean vel: 2.06 cm2
AV Mean grad: 3 mmHg
AV Peak grad: 5.2 mmHg
Ao pk vel: 1.15 m/s
Area-P 1/2: 3.07 cm2
MV VTI: 1.83 cm2
S' Lateral: 2.6 cm

## 2021-04-03 NOTE — Progress Notes (Signed)
*  PRELIMINARY RESULTS* ?Echocardiogram ?2D Echocardiogram has been performed. ? ?Arsen Mangione, [place] ?04/03/2021, 11:41 AM ?

## 2021-04-03 NOTE — Telephone Encounter (Signed)
Called patient reviewed all information and repeated back to me. Will call if any questions.  ? ?

## 2021-04-08 ENCOUNTER — Encounter: Payer: Self-pay | Admitting: Acute Care

## 2021-04-08 ENCOUNTER — Ambulatory Visit (INDEPENDENT_AMBULATORY_CARE_PROVIDER_SITE_OTHER): Payer: PPO | Admitting: Acute Care

## 2021-04-08 DIAGNOSIS — F17210 Nicotine dependence, cigarettes, uncomplicated: Secondary | ICD-10-CM

## 2021-04-08 NOTE — Progress Notes (Signed)
Virtual Visit via Telephone Note ? ?I connected with Bruce Lara on 11/19/20 at  2:00 PM EST by telephone and verified that I am speaking with the correct person using two identifiers. ? ?Location: ?Patient: Home ?Provider: Working from home ?  ?I discussed the limitations, risks, security and privacy concerns of performing an evaluation and management service by telephone and the availability of in person appointments. I also discussed with the patient that there may be a patient responsible charge related to this service. The patient expressed understanding and agreed to proceed. ? ?Shared Decision Making Visit Lung Cancer Screening Program ?[phone]) ? ? ?Eligibility: ?Age 71 y.o. ?Pack Years Smoking History Calculation 34 ?(# packs/per year x # years smoked) ?Recent History of coughing up blood  no ?Unexplained weight loss? no ?( >Than 15 pounds within the last 6 months ) ?Prior History Lung / other cancer no ?(Diagnosis within the last 5 years already requiring surveillance chest CT Scans). ?Smoking Status Current Smoker ?Former Smokers: Years since quit: NA ? Quit Date: NA ? ?Visit Components: ?Discussion included one or more decision making aids. yes ?Discussion included risk/benefits of screening. yes ?Discussion included potential follow up diagnostic testing for abnormal scans. yes ?Discussion included meaning and risk of over diagnosis. yes ?Discussion included meaning and risk of False Positives. yes ?Discussion included meaning of total radiation exposure. yes ? ?Counseling Included: ?Importance of adherence to annual lung cancer LDCT screening. yes ?Impact of comorbidities on ability to participate in the program. yes ?Ability and willingness to under diagnostic treatment. yes ? ?Smoking Cessation Counseling: ?Current Smokers:  ?Discussed importance of smoking cessation. yes ?Information about tobacco cessation classes and interventions provided to patient. yes ?Patient provided with "ticket" for LDCT  Scan. yes ?Symptomatic Patient. yes ? Counseling(Intermediate counseling: > three minutes) 99406 ?Diagnosis Code: Tobacco Use Z72.0 ?Asymptomatic Patient no ? Counseling NA ?Former Smokers:  ?Discussed the importance of maintaining cigarette abstinence. yes ?Diagnosis Code: Personal History of Nicotine Dependence. B28.413 ?Information about tobacco cessation classes and interventions provided to patient. Yes ?Patient provided with "ticket" for LDCT Scan. yes ?Written Order for Lung Cancer Screening with LDCT placed in Epic. Yes ?(CT Chest Lung Cancer Screening Low Dose W/O CM) [id number] ?Z12.2-Screening of respiratory organs ?Z87.891-Personal history of nicotine dependence ? ? ?I spent 25 minutes of face to face time with him discussing the risks and benefits of lung cancer screening. We viewed a power point together that explained in detail the above noted topics. We took the time to pause the power point at intervals to allow for questions to be asked and answered to ensure understanding. We discussed that he had taken the single most powerful action possible to decrease his risk of developing lung cancer when he quit smoking. I counseled him to remain smoke free, and to contact me if he ever had the desire to smoke again so that I can provide resources and tools to help support the effort to remain smoke free. We discussed the time and location of the scan, and that either  Bruce Glassman RN or I will call with the results within  24-48 hours of receiving them. He has my card and contact information in the event he needs to speak with me, in addition to a copy of the power point we reviewed as a resource. He verbalized understanding of all of the above and had no further questions upon leaving the office.  ? ? ? ?I explained to the patient that there has been a  high incidence of coronary artery disease noted on these exams. I explained that this is a non-gated exam therefore degree or severity cannot be determined.  This patient is not on statin therapy. I have asked the patient to follow-up with their PCP regarding any incidental finding of coronary artery disease and management with diet or medication as they feel is clinically indicated. The patient verbalized understanding of the above and had no further questions. ? ? ?I spent 3 minutes counseling on smoking cessation and the health risks of continued tobacco abuse  ? ? ?Algenis Ballin D. Harris, NP-C ?[facility] ?Personal contact information can be found on Amion  ?04/08/2021, 10:10 AM ? ? ? ? ? ? ? ? ? ?

## 2021-04-08 NOTE — Patient Instructions (Signed)
Thank you for participating in the [facility] Scre[facility]. It was our pleasure to meet you today. We will call you with the results of your scan within the next few days. Your scan will be assigned a Lung RADS category score by the physicians reading the scans.  This Lung RADS score determines follow up scanning.  See below for description of categories, and follow up screening recommendations. We will be in touch to schedule your follow up screening annually or based on recommendations of our providers. We will fax a copy of your scan results to your Primary Care Physician, or the physician who referred you to the program, to ensure they have the results. Please call the office if you have any questions or concerns regarding your scanning experience or results.  Our office number is [phone]. Please speak with Denise Phelps, RN. , or  Denise Buckner RN, They are  our Lung Cancer Screening RN.'s If They are unavailable when you call, Please leave a message on the voice mail. We will return your call at our earliest convenience.This voice mail is monitored several times a day.  Remember, if your scan is normal, we will scan you annually as long as you continue to meet the criteria for the program. (Age 55-77, Current smoker or smoker who has quit within the last 15 years). If you are a smoker, remember, quitting is the single most powerful action that you can take to decrease your risk of lung cancer and other pulmonary, breathing related problems. We know quitting is hard, and we are here to help.  Please let us know if there is anything we can do to help you meet your goal of quitting. If you are a former smoker, congratulations. We are proud of you! Remain smoke free! Remember you can refer friends or family members through the number above.  We will screen them to make sure they meet criteria for the program. Thank you for helping us take better care of you by  participating in Lung Screening.  You can receive free nicotine replacement therapy ( patches, gum or mints) by calling 1-800-QUIT NOW. Please call so we can get you on the path to becoming  a non-smoker. I know it is hard, but you can do this!  Lung RADS Categories:  Lung RADS 1: no nodules or definitely non-concerning nodules.  Recommendation is for a repeat annual scan in 12 months.  Lung RADS 2:  nodules that are non-concerning in appearance and behavior with a very low likelihood of becoming an active cancer. Recommendation is for a repeat annual scan in 12 months.  Lung RADS 3: nodules that are probably non-concerning , includes nodules with a low likelihood of becoming an active cancer.  Recommendation is for a 6-month repeat screening scan. Often noted after an upper respiratory illness. We will be in touch to make sure you have no questions, and to schedule your 6-month scan.  Lung RADS 4 A: nodules with concerning findings, recommendation is most often for a follow up scan in 3 months or additional testing based on our provider's assessment of the scan. We will be in touch to make sure you have no questions and to schedule the recommended 3 month follow up scan.  Lung RADS 4 B:  indicates findings that are concerning. We will be in touch with you to schedule additional diagnostic testing based on our provider's  assessment of the scan.  Other options for assistance in smoking cessation (   As covered by your insurance benefits)  Hypnosis for smoking cessation  [employer] [phone]  Acupuncture for smoking cessation  [facility] [phone]   

## 2021-04-14 ENCOUNTER — Ambulatory Visit
Admission: RE | Admit: 2021-04-14 | Discharge: 2021-04-14 | Disposition: A | Discharge status: Home / Self Care | Payer: PPO | Source: Ambulatory Visit | Attending: Family | Admitting: Family

## 2021-04-14 DIAGNOSIS — F17210 Nicotine dependence, cigarettes, uncomplicated: Secondary | ICD-10-CM

## 2021-04-14 DIAGNOSIS — Z87891 Personal history of nicotine dependence: Secondary | ICD-10-CM

## 2021-04-21 ENCOUNTER — Telehealth: Payer: Self-pay | Admitting: *Deleted

## 2021-04-21 NOTE — Telephone Encounter (Signed)
Left message for pt to call back to discuss CT results.  ?

## 2021-04-23 ENCOUNTER — Other Ambulatory Visit (INDEPENDENT_AMBULATORY_CARE_PROVIDER_SITE_OTHER): Payer: Self-pay | Admitting: Nurse Practitioner

## 2021-04-23 DIAGNOSIS — R29898 Other symptoms and signs involving the musculoskeletal system: Secondary | ICD-10-CM

## 2021-04-24 ENCOUNTER — Ambulatory Visit (INDEPENDENT_AMBULATORY_CARE_PROVIDER_SITE_OTHER): Payer: PPO

## 2021-04-24 ENCOUNTER — Encounter (INDEPENDENT_AMBULATORY_CARE_PROVIDER_SITE_OTHER): Payer: Self-pay | Admitting: Nurse Practitioner

## 2021-04-24 ENCOUNTER — Ambulatory Visit (INDEPENDENT_AMBULATORY_CARE_PROVIDER_SITE_OTHER): Payer: PPO | Admitting: Nurse Practitioner

## 2021-04-24 VITALS — BP 121/75 | HR 79 | Resp 16 | Ht 66.0 in | Wt 172.8 lb

## 2021-04-24 DIAGNOSIS — I10 Essential (primary) hypertension: Secondary | ICD-10-CM

## 2021-04-24 DIAGNOSIS — R29898 Other symptoms and signs involving the musculoskeletal system: Secondary | ICD-10-CM

## 2021-04-24 DIAGNOSIS — F17200 Nicotine dependence, unspecified, uncomplicated: Secondary | ICD-10-CM | POA: Diagnosis not present

## 2021-04-25 ENCOUNTER — Other Ambulatory Visit: Payer: Self-pay | Admitting: Acute Care

## 2021-04-25 DIAGNOSIS — F17210 Nicotine dependence, cigarettes, uncomplicated: Secondary | ICD-10-CM

## 2021-04-25 DIAGNOSIS — Z122 Encounter for screening for malignant neoplasm of respiratory organs: Secondary | ICD-10-CM

## 2021-04-25 DIAGNOSIS — Z87891 Personal history of nicotine dependence: Secondary | ICD-10-CM

## 2021-04-25 NOTE — Telephone Encounter (Signed)
Letter sent to pt with CT results. Results faxed to PCP with f/u plans included. Order placed for 6 mth nodule f/u CT.  ?

## 2021-04-29 ENCOUNTER — Other Ambulatory Visit: Payer: Self-pay | Admitting: Family

## 2021-04-29 ENCOUNTER — Telehealth: Payer: Self-pay

## 2021-04-29 DIAGNOSIS — K7689 Other specified diseases of liver: Secondary | ICD-10-CM

## 2021-04-29 DIAGNOSIS — R7989 Other specified abnormal findings of blood chemistry: Secondary | ICD-10-CM

## 2021-04-29 NOTE — Telephone Encounter (Signed)
-----   Message from Eugenia Pancoast, [place] sent at 04/29/2021 11:51 AM EDT ----- ?Please advise pt I reviewed his recent CT scan of his lung from lung screening.  ? ?They did see a nodular appearance of the liver suggestive of cirrhosis. Along with elevated bilirubin last last test I am referring him to GI to evaluate further. Advise pt if drinking alcohol try to decrease.  ?If he doesn't hear anything in regards to the referral in 1-2 weeks please let me know.  ? ?----- Message ----- ?From: Christie Beckers, RN ?Sent: 04/25/2021   1:32 PM EDT ?To: Eugenia Pancoast, FNP ? ?We will repeat CT in 6 months ? ?

## 2021-04-29 NOTE — Telephone Encounter (Signed)
Called patient reviewed all information and repeated back to me. Will call if any questions.  ? ?

## 2021-05-10 ENCOUNTER — Encounter (INDEPENDENT_AMBULATORY_CARE_PROVIDER_SITE_OTHER): Payer: Self-pay | Admitting: Nurse Practitioner

## 2021-05-10 NOTE — Progress Notes (Signed)
? ?Subjective:  ? ? Patient ID: Bruce Lara, male    DOB: 11/09/1950, 71 y.o.   MRN: [id number] ?No chief complaint on file. ? ? ?Bruce Lara is a 71 year old male that is referred by Bruce Colla, FNP for lower extremity weakness.  He recently had a DVT study done which was negative.  The weakness is difficult for the patient to describe.  He notes it as more of a deconditioning and feeling winded.  He denies classic claudication-like symptoms.  He notes that he has some difficulty with walking up stairs and that he needs to hold onto the railing.  He denies any rest pain.  He denies any open wounds or ulcerations.  He does note some swelling in the ankle areas but no significant issues.  He notes that he previously had bursitis in both of his knees and during this time he can barely walk. ? ?Today noninvasive studies show an ABI of 1.06 on the right and 1.09 on the left.  He has triphasic tibial artery waveforms bilaterally with good toe waveforms bilaterally. ? ? ?Review of Systems  ?Neurological:  Positive for weakness.  ?All other systems reviewed and are negative. ? ?   ?Objective:  ? Physical Exam ?Vitals reviewed.  ?HENT:  ?   Head: Normocephalic.  ?Cardiovascular:  ?   Rate and Rhythm: Normal rate.  ?   Pulses: Normal pulses.  ?Pulmonary:  ?   Effort: Pulmonary effort is normal.  ?Skin: ?   General: Skin is warm and dry.  ?Neurological:  ?   Mental Status: He is alert and oriented to person, place, and time.  ?   Motor: Weakness present.  ?Psychiatric:     ?   Mood and Affect: Mood normal.     ?   Behavior: Behavior normal.     ?   Thought Content: Thought content normal.     ?   Judgment: Judgment normal.  ? ? ?BP 121/75 (BP Location: Right Arm)   Pulse 79   Resp 16   Ht '5\' 6"'$  (1.676 m)   Wt 172 lb 12.8 oz (78.4 kg)   BMI 27.89 kg/m?  ? ?Past Medical History:  ?Diagnosis Date  ? Alcohol abuse   ? Allergy   ? Anxiety   ? Depression   ? GERD (gastroesophageal reflux disease)   ? Hyperlipidemia   ?  Hypertension   ? ? ?Social History  ? ?Socioeconomic History  ? Marital status: Widowed  ?  Spouse name: Not on file  ? Number of children: Not on file  ? Years of education: Not on file  ? Highest education level: Not on file  ?Occupational History  ? Not on file  ?Tobacco Use  ? Smoking status: Every Day  ?  Packs/day: 1.00  ?  Years: 34.00  ?  Pack years: 34.00  ?  Types: Cigarettes  ? Smokeless tobacco: Never  ?Substance and Sexual Activity  ? Alcohol use: Yes  ?  Alcohol/week: 3.0 standard drinks  ?  Types: 3 Cans of beer per week  ?  Comment: daily---40 oz daily  ? Drug use: No  ? Sexual activity: Not Currently  ?  Partners: Female  ?Other Topics Concern  ? Not on file  ?Social History Narrative  ? Not on file  ? ?Social Determinants of Health  ? ?Financial Resource Strain: Not on file  ?Food Insecurity: Not on file  ?Transportation Needs: Not on file  ?Physical Activity: Not on  file  ?Stress: Not on file  ?Social Connections: Not on file  ?Intimate Partner Violence: Not on file  ? ? ?History reviewed. No pertinent surgical history. ? ?Family History  ?Problem Relation Age of Onset  ? Alcohol abuse Father   ? Hypertension Father   ? Colon cancer Neg Hx   ? Esophageal cancer Neg Hx   ? Liver cancer Neg Hx   ? Pancreatic cancer Neg Hx   ? Rectal cancer Neg Hx   ? Stomach cancer Neg Hx   ? ? ?No Known Allergies ? ? ?  Latest Ref Rng & Units 03/18/2021  ? 11:19 AM 03/07/2021  ? 11:04 AM 04/22/2017  ?  1:31 PM  ?CBC  ?WBC 4.0 - 10.5 K/uL 8.6   7.1     ?Hemoglobin 13.0 - 17.0 g/dL 17.0   18.0 Repeated and verified X2.   15.0    ?Hematocrit 39.0 - 52.0 % 49.4   52.0   44.0    ?Platelets 150.0 - 400.0 K/uL 213.0   218.0     ? ? ? ? ?CMP  ?   ?Component Value Date/Time  ? NA 136 03/07/2021 1104  ? K 4.3 03/07/2021 1104  ? CL 102 03/07/2021 1104  ? CO2 26 03/07/2021 1104  ? GLUCOSE 95 03/07/2021 1104  ? BUN 10 03/07/2021 1104  ? CREATININE 0.97 03/07/2021 1104  ? CREATININE 1.52 (H) 01/01/2017 1547  ? CALCIUM 9.4 03/07/2021  1104  ? PROT 7.6 03/07/2021 1104  ? ALBUMIN 4.2 03/07/2021 1104  ? AST 31 03/07/2021 1104  ? ALT 17 03/07/2021 1104  ? ALKPHOS 119 (H) 03/07/2021 1104  ? BILITOT 1.5 (H) 03/07/2021 1104  ? GFRNONAA 56 (L) 04/22/2017 1049  ? GFRAA >60 04/22/2017 1049  ? ? ? ?VAS [place] ABI WITH/WO TBI ? ?Result Date: 04/28/2021 ? LOWER EXTREMITY DOPPLER STUDY Patient Name:  Bruce Lara  Date of Exam:   04/24/2021 Medical Rec #: [id number]      Accession #:    [id number] Date of Birth: [birth year]      Patient Gender: M Patient Age:   55 years Exam Location:   [facility] Procedure:      VAS [place] ABI WITH/WO TBI Referring Phys: --------------------------------------------------------------------------------  Indications: Leg weakness.  Performing Technologist: Concha Norway RVT  Examination Guidelines: A complete evaluation includes at minimum, Doppler waveform signals and systolic blood pressure reading at the level of bilateral brachial, anterior tibial, and posterior tibial arteries, when vessel segments are accessible. Bilateral testing is considered an integral part of a complete examination. Photoelectric Plethysmograph (PPG) waveforms and toe systolic pressure readings are included as required and additional duplex testing as needed. Limited examinations for reoccurring indications may be performed as noted.  ABI Findings: +---------+------------------+-----+---------+--------+ Right    Rt Pressure (mmHg)IndexWaveform Comment  +---------+------------------+-----+---------+--------+ Brachial 143                                      +---------+------------------+-----+---------+--------+ ATA      151               1.06 triphasic         +---------+------------------+-----+---------+--------+ PTA      148               1.03 triphasic         +---------+------------------+-----+---------+--------+ Bruce Lara  0.78 Normal             +---------+------------------+-----+---------+--------+ +---------+------------------+-----+---------+-------+ Left     Lt Pressure (mmHg)IndexWaveform Comment +---------+------------------+-----+---------+-------+ Brachial 140                                     +---------+------------------+-----+---------+-------+ ATA      147               1.03 triphasic        +---------+------------------+-----+---------+-------+ PTA      156               1.09 triphasic        +---------+------------------+-----+---------+-------+ Great Toe124               0.87 Normal           +---------+------------------+-----+---------+-------+  Summary: Right: Resting right ankle-brachial index is within normal range. No evidence of significant right lower extremity arterial disease. The right toe-brachial index is normal. Left: Resting left ankle-brachial index is within normal range. No evidence of significant left lower extremity arterial disease. The left toe-brachial index is normal. *See table(s) above for measurements and observations.  Electronically signed by Leotis Pain MD on 04/28/2021 at 3:18:07 PM.    Final   ? ? ?   ?Assessment & Plan:  ? ?1. Weakness of lower extremity, unspecified laterality ?Recommend: ? ?The patient has atypical pain symptoms for vascular disease and on exam I do not find evidence of vascular pathology that would explain the patient's symptoms.  Noninvasive studies do not identify significant vascular problems ? ?The patient feels that this is more so related to disuse.  He is advised to continue to follow-up with her primary care provider if it should worsen. ? ?The patient should continue walking and begin a more formal exercise program. ?The patient should continue his antiplatelet therapy and aggressive treatment of the lipid abnormalities. ? ?Patient will follow-up with me on a PRN basis.  ?- VAS [place] ABI WITH/WO TBI ? ?2. Essential hypertension ?Continue antihypertensive  medications as already ordered, these medications have been reviewed and there are no changes at this time.  ? ?3. Tobacco use disorder ?Smoking cessation was discussed, 3-10 minutes spent on this topic specifically  ? ? ?Current Outpatient Medications on File Prior to Visit  ?Medication Sig Dispense Refill  ? amLODipine (NORVA

## 2021-08-13 ENCOUNTER — Ambulatory Visit: Payer: PPO | Admitting: Gastroenterology

## 2021-08-13 NOTE — Progress Notes (Deleted)
Gastroenterology Consultation  Referring Provider:     Eugenia Pancoast, FNP Primary Care Physician:  Bruce Pancoast, FNP Primary Gastroenterologist:  Dr. Allen Lara     Reason for Consultation:     ***        HPI:   Bruce Lara is a 71 y.o. y/o male referred for consultation & management of *** by Dr. Eugenia Lara, [place].  This patient is being sent to me after being seen in the past by [facility] gastroenterology and a colonoscopy in 2019 by Dr. Hilarie Lara.  The patient has a past medical history of GERD hypertension alcohol abuse and hyperlipidemia.  In April the patient had a CT scan of the chest that showed:  IMPRESSION: 1. Lung-RADS 3, probably benign findings. Short-term follow-up in 6 months is recommended with repeat low-dose chest CT without contrast (please use the following order, CT CHEST LCS NODULE FOLLOW-UP W/O CM). 2. Nodular liver contour, findings are suggestive of cirrhosis. 3. Left main and three-vessel coronary artery calcifications, recommend ASCVD risk assessment. 4. Aortic Atherosclerosis (ICD10-I70.0) and Emphysema (ICD10-J43.9).  The patient's blood work showed:  Total Bilirubin 0.2 - 1.2 mg/dL 1.5 High       0.7  0.4   Alkaline Phosphatase 39 - 117 U/L 119 High       63    AST 0 - 37 U/L 31      42 High   23 R   ALT 0 - 53 U/L 17          His CBC did not show any thrombocytopenia and his albumin was normal.  The patient's MCV was borderline high also    Past Medical History:  Diagnosis Date   Alcohol abuse    Allergy    Anxiety    Depression    GERD (gastroesophageal reflux disease)    Hyperlipidemia    Hypertension     No past surgical history on file.  Prior to Admission medications   Medication Sig Start Date End Date Taking? Authorizing Provider  amLODipine (NORVASC) 5 MG tablet Take 1 tablet (5 mg total) by mouth daily. 03/07/21   Bruce Pancoast, FNP  aspirin 325 MG tablet Take 325 mg by mouth daily.    [provider]  BOOSTRIX  5-2.5-18.5 LF-MCG/0.5 injection  03/21/21   [provider]  Omega-3 Fatty Acids (FISH OIL) 1000 MG CAPS Take by mouth.    [provider]  PREVNAR 20 0.5 ML injection  03/21/21   [provider]  [facility] injection  03/21/21   [provider]  simvastatin (ZOCOR) 20 MG tablet Take 1 tablet (20 mg total) by mouth at bedtime. 03/07/21 09/03/21  Bruce Pancoast, FNP    Family History  Problem Relation Age of Onset   Alcohol abuse Father    Hypertension Father    Colon cancer Neg Hx    Esophageal cancer Neg Hx    Liver cancer Neg Hx    Pancreatic cancer Neg Hx    Rectal cancer Neg Hx    Stomach cancer Neg Hx      Social History   Tobacco Use   Smoking status: Every Day    Packs/day: 1.00    Years: 34.00    Total pack years: 34.00    Types: Cigarettes   Smokeless tobacco: Never  Substance Use Topics   Alcohol use: Yes    Alcohol/week: 3.0 standard drinks of alcohol    Types: 3 Cans of beer per week  Comment: daily---40 oz daily   Drug use: No    Allergies as of 08/13/2021   (No Known Allergies)    Review of Systems:    All systems reviewed and negative except where noted in HPI.   Physical Exam:  There were no vitals taken for this visit. No LMP for male patient. General:   Alert,  Well-developed, ***well-nourished, pleasant and cooperative in NAD Head:  Normocephalic and atraumatic. Eyes:  Sclera clear, no icterus.   Conjunctiva pink. Ears:  Normal auditory acuity. Neck:  Supple; no masses or thyromegaly. Lungs:  Respirations even and unlabored.  Clear throughout to auscultation.   No wheezes, crackles, or rhonchi. No acute distress. Heart:  Regular rate and rhythm; no murmurs, clicks, rubs, or gallops. Abdomen:  Normal bowel sounds.  No bruits.  Soft, non-tender and non-distended without masses, hepatosplenomegaly or hernias noted.  No guarding or rebound tenderness.  ***Negative Carnett sign.   Rectal:  Deferred.***  Pulses:  Normal  pulses noted. Extremities:  No clubbing or edema.  No cyanosis. Neurologic:  Alert and oriented x3;  grossly normal neurologically. Skin:  Intact without significant lesions or rashes.  ***No jaundice. Lymph Nodes:  No significant cervical adenopathy. Psych:  Alert and cooperative. Normal mood and affect.  Imaging Studies: No results found.  Assessment and Plan:   Bruce Lara is a 71 y.o. y/o male ***    Bruce Lame, MD. Bruce Lara    Note: This dictation was prepared with Dragon dictation along with smaller phrase technology. Any transcriptional errors that result from this process are unintentional.

## 2021-09-20 ENCOUNTER — Other Ambulatory Visit: Payer: Self-pay | Admitting: Family

## 2021-09-20 DIAGNOSIS — I10 Essential (primary) hypertension: Secondary | ICD-10-CM

## 2021-09-24 ENCOUNTER — Telehealth: Payer: Self-pay | Admitting: Family

## 2021-09-24 ENCOUNTER — Other Ambulatory Visit: Payer: Self-pay | Admitting: Family

## 2021-09-24 DIAGNOSIS — E782 Mixed hyperlipidemia: Secondary | ICD-10-CM

## 2021-09-24 DIAGNOSIS — I10 Essential (primary) hypertension: Secondary | ICD-10-CM

## 2021-09-24 NOTE — Telephone Encounter (Signed)
Called and made pt an appointment for med refills

## 2021-09-24 NOTE — Telephone Encounter (Signed)
  Encourage patient to contact the pharmacy for refills or they can request refills through [facility]  Did the patient contact the pharmacy: y    LAST APPOINTMENT DATE:04/01/21  NEXT APPOINTMENT DATE:  MEDICATION:simvastatin (ZOCOR) 20 MG tablet (Expired) amLODipine (NORVASC) 5 MG tablet  Is the patient out of medication? y  If not, how much is left?  Is this a 21 day supply: y  PHARMACY: Insurance claims handler [place], [place] - [place] Phone:  [phone]  Fax:  [phone]      Let patient know to contact pharmacy at the end of the day to make sure medication is ready.  Please notify patient to allow 48-72 hours to process

## 2021-09-25 ENCOUNTER — Encounter: Payer: Self-pay | Admitting: Family

## 2021-09-25 ENCOUNTER — Ambulatory Visit
Admission: RE | Admit: 2021-09-25 | Discharge: 2021-09-25 | Disposition: A | Discharge status: Home / Self Care | Payer: PPO | Source: Ambulatory Visit | Attending: Family | Admitting: Family

## 2021-09-25 ENCOUNTER — Ambulatory Visit (INDEPENDENT_AMBULATORY_CARE_PROVIDER_SITE_OTHER): Payer: PPO | Admitting: Family

## 2021-09-25 ENCOUNTER — Telehealth: Payer: Self-pay | Admitting: Family

## 2021-09-25 VITALS — BP 124/60 | HR 76 | Temp 97.9°F | Resp 16 | Ht 66.0 in | Wt 162.0 lb

## 2021-09-25 DIAGNOSIS — Z1211 Encounter for screening for malignant neoplasm of colon: Secondary | ICD-10-CM | POA: Diagnosis not present

## 2021-09-25 DIAGNOSIS — Z860100 Personal history of colon polyps, unspecified: Secondary | ICD-10-CM

## 2021-09-25 DIAGNOSIS — K7460 Unspecified cirrhosis of liver: Secondary | ICD-10-CM

## 2021-09-25 DIAGNOSIS — R1011 Right upper quadrant pain: Secondary | ICD-10-CM

## 2021-09-25 DIAGNOSIS — E782 Mixed hyperlipidemia: Secondary | ICD-10-CM | POA: Diagnosis not present

## 2021-09-25 DIAGNOSIS — I83892 Varicose veins of left lower extremities with other complications: Secondary | ICD-10-CM | POA: Diagnosis not present

## 2021-09-25 DIAGNOSIS — J439 Emphysema, unspecified: Secondary | ICD-10-CM

## 2021-09-25 DIAGNOSIS — Z23 Encounter for immunization: Secondary | ICD-10-CM | POA: Diagnosis not present

## 2021-09-25 DIAGNOSIS — E7800 Pure hypercholesterolemia, unspecified: Secondary | ICD-10-CM

## 2021-09-25 DIAGNOSIS — R634 Abnormal weight loss: Secondary | ICD-10-CM

## 2021-09-25 DIAGNOSIS — I10 Essential (primary) hypertension: Secondary | ICD-10-CM | POA: Diagnosis not present

## 2021-09-25 DIAGNOSIS — R29898 Other symptoms and signs involving the musculoskeletal system: Secondary | ICD-10-CM

## 2021-09-25 DIAGNOSIS — R8290 Unspecified abnormal findings in urine: Secondary | ICD-10-CM | POA: Diagnosis not present

## 2021-09-25 DIAGNOSIS — R7989 Other specified abnormal findings of blood chemistry: Secondary | ICD-10-CM

## 2021-09-25 DIAGNOSIS — I700 Atherosclerosis of aorta: Secondary | ICD-10-CM | POA: Diagnosis not present

## 2021-09-25 LAB — LIPID PANEL
Cholesterol: 124 mg/dL (ref 0–200)
HDL: 44.5 mg/dL (ref >39.00)
LDL Cholesterol: 65 mg/dL (ref 0–99)
NonHDL: 79.5
Total CHOL/HDL Ratio: 3
Triglycerides: 75 mg/dL (ref 0.0–149.0)
VLDL: 15 mg/dL (ref 0.0–40.0)

## 2021-09-25 LAB — URINALYSIS, ROUTINE W REFLEX MICROSCOPIC
Bilirubin Urine: NEGATIVE
Hgb urine dipstick: NEGATIVE
Ketones, ur: NEGATIVE
Leukocytes,Ua: NEGATIVE
Nitrite: NEGATIVE
RBC / HPF: NONE SEEN (ref 0–?)
Specific Gravity, Urine: 1.025 (ref 1.000–1.030)
Total Protein, Urine: NEGATIVE
Urine Glucose: NEGATIVE
Urobilinogen, UA: 0.2 (ref 0.0–1.0)
pH: 6 (ref 5.0–8.0)

## 2021-09-25 LAB — COMPREHENSIVE METABOLIC PANEL
ALT: 26 U/L (ref 0–53)
AST: 30 U/L (ref 0–37)
Albumin: 3.3 g/dL — ABNORMAL LOW (ref 3.5–5.2)
Alkaline Phosphatase: 144 U/L — ABNORMAL HIGH (ref 39–117)
BUN: 5 mg/dL — ABNORMAL LOW (ref 6–23)
CO2: 30 mEq/L (ref 19–32)
Calcium: 9.5 mg/dL (ref 8.4–10.5)
Chloride: 104 mEq/L (ref 96–112)
Creatinine, Ser: 0.85 mg/dL (ref 0.40–1.50)
GFR: 88.78 mL/min (ref >60.00)
Glucose, Bld: 69 mg/dL — ABNORMAL LOW (ref 70–99)
Potassium: 3.6 mEq/L (ref 3.5–5.1)
Sodium: 140 mEq/L (ref 135–145)
Total Bilirubin: 0.7 mg/dL (ref 0.2–1.2)
Total Protein: 6.7 g/dL (ref 6.0–8.3)

## 2021-09-25 LAB — CBC
HCT: 40.7 % (ref 39.0–52.0)
Hemoglobin: 14.2 g/dL (ref 13.0–17.0)
MCHC: 34.8 g/dL (ref 30.0–36.0)
MCV: 96.8 fl (ref 78.0–100.0)
Platelets: 226 10*3/uL (ref 150.0–400.0)
RBC: 4.2 Mil/uL — ABNORMAL LOW (ref 4.22–5.81)
RDW: 13.1 % (ref 11.5–15.5)
WBC: 10.2 10*3/uL (ref 4.0–10.5)

## 2021-09-25 LAB — BRAIN NATRIURETIC PEPTIDE: Pro B Natriuretic peptide (BNP): 137 pg/mL — ABNORMAL HIGH (ref 0.0–100.0)

## 2021-09-25 LAB — AMYLASE: Amylase: 56 U/L (ref 27–131)

## 2021-09-25 LAB — LIPASE: Lipase: 48 U/L (ref 11.0–59.0)

## 2021-09-25 MED ORDER — AMLODIPINE BESYLATE 5 MG PO TABS: 5.0000 mg | ORAL_TABLET | Freq: Every day | ORAL | 1 refills | Status: DC

## 2021-09-25 MED ORDER — IOPAMIDOL (ISOVUE-300) INJECTION 61%
100.0000 mL | Freq: Once | INTRAVENOUS | Status: AC | PRN
  Administered 2021-09-25: 100 mL via INTRAVENOUS

## 2021-09-25 MED ORDER — ALBUTEROL SULFATE HFA 108 (90 BASE) MCG/ACT IN AERS: 2.0000 | INHALATION_SPRAY | Freq: Four times a day (QID) | RESPIRATORY_TRACT | 0 refills | Status: DC | PRN

## 2021-09-25 MED ORDER — ANORO ELLIPTA 62.5-25 MCG/ACT IN AEPB: 1.0000 | INHALATION_SPRAY | Freq: Every day | RESPIRATORY_TRACT | 2 refills | Status: AC

## 2021-09-25 MED ORDER — SIMVASTATIN 20 MG PO TABS: 20.0000 mg | ORAL_TABLET | Freq: Every day | ORAL | 1 refills | Status: AC

## 2021-09-25 NOTE — Progress Notes (Signed)
ro  Established Patient Office Visit  Subjective:  Patient ID: Bruce Lara, male    DOB: 01-Jan-1951  Age: 71 y.o. MRN: [id number]  CC:  Chief Complaint  Patient presents with   Medication Refill    HPI Bruce Lara is here today for follow up.  Callback: [phone]  Pt is with acute concerns.  Two weeks ago with one week of throwing up and feeling nausea. Didn't eat much and lost about 20 pounds. Now getting appetite back, and now at 162 pounds. Slowly gaining appetite back and has since gained back about ten pounds. No nausea, no vomiting, and no diarrhea in the last one week. Yesterday with slight heart burn better today. No abdominal pain today.   Still with lower extremity weakness, and feels unsteady walking on them. Saw vascular doctor back in 4/20 but negative vascular work up. Still often sitting around the house.   Reviewed CT chest 4/10 ordered by Eric Form, FNP (this is my first time reviewing this as pt was overdue for his appt and have not seen him since March 2023) suggestive of possible cirrhosis and with aortic artherosclerosis. Also requiring six month f/u CT due in October 2023 for pulmonary nodules.   Wt Readings from Last 3 Encounters:  09/25/21 162 lb (73.5 kg)  04/24/21 172 lb 12.8 oz (78.4 kg)  04/01/21 175 lb 4 oz (79.5 kg)   Emphysema mild paraseptal, with sob and doe. Small solid poumonary nodules short term repeat CT was suggested.    Past Medical History:  Diagnosis Date   Alcohol abuse    Allergy    Anxiety    Depression    GERD (gastroesophageal reflux disease)    Hyperlipidemia    Hypertension     No past surgical history on file.  Family History  Problem Relation Age of Onset   Alcohol abuse Father    Hypertension Father    Colon cancer Neg Hx    Esophageal cancer Neg Hx    Liver cancer Neg Hx    Pancreatic cancer Neg Hx    Rectal cancer Neg Hx    Stomach cancer Neg Hx     Social History   Socioeconomic History    Marital status: Widowed    Spouse name: Not on file   Number of children: Not on file   Years of education: Not on file   Highest education level: Not on file  Occupational History   Not on file  Tobacco Use   Smoking status: Every Day    Packs/day: 1.00    Years: 34.00    Total pack years: 34.00    Types: Cigarettes   Smokeless tobacco: Never  Substance and Sexual Activity   Alcohol use: Yes    Alcohol/week: 3.0 standard drinks of alcohol    Types: 3 Cans of beer per week    Comment: daily---40 oz daily   Drug use: No   Sexual activity: Not Currently    Partners: Female  Other Topics Concern   Not on file  Social History Narrative   Not on file   Social Determinants of Health   Financial Resource Strain: Not on file  Food Insecurity: Not on file  Transportation Needs: Not on file  Physical Activity: Not on file  Stress: Not on file  Social Connections: Not on file  Intimate Partner Violence: Not on file    Outpatient Medications Prior to Visit  Medication Sig Dispense Refill   aspirin 325 MG  tablet Take 325 mg by mouth daily.     Omega-3 Fatty Acids (FISH OIL) 1000 MG CAPS Take by mouth.     amLODipine (NORVASC) 5 MG tablet Take 1 tablet (5 mg total) by mouth daily. 90 tablet 1   SHINGRIX injection      BOOSTRIX 5-2.5-18.5 LF-MCG/0.5 injection  (Patient not taking: Reported on 09/25/2021)     PREVNAR 20 0.5 ML injection  (Patient not taking: Reported on 09/25/2021)     simvastatin (ZOCOR) 20 MG tablet Take 1 tablet (20 mg total) by mouth at bedtime. 90 tablet 1   No facility-administered medications prior to visit.    No Known Allergies       Objective:    Physical Exam Constitutional:      General: He is not in acute distress.    Appearance: Normal appearance. He is normal weight. He is not ill-appearing, toxic-appearing or diaphoretic.  Cardiovascular:     Rate and Rhythm: Normal rate and regular rhythm.  Pulmonary:     Effort: Pulmonary effort is  normal.     Breath sounds: Decreased air movement present. No wheezing.  Abdominal:     Palpations: Abdomen is soft.     Tenderness: There is abdominal tenderness in the right upper quadrant and epigastric area. There is guarding. Positive signs include Murphy's sign (possible). Negative signs include McBurney's sign.  Neurological:     General: No focal deficit present.     Mental Status: He is alert and oriented to person, place, and time. Mental status is at baseline.     Motor: Weakness (lower leg weakness) present.  Psychiatric:        Mood and Affect: Mood normal.        Behavior: Behavior normal.        Thought Content: Thought content normal.        Judgment: Judgment normal.       BP 124/60   Pulse 76   Temp 97.9 F (36.6 C)   Resp 16   Ht '5\' 6"'$  (1.676 m)   Wt 162 lb (73.5 kg)   SpO2 98%   BMI 26.15 kg/m  Wt Readings from Last 3 Encounters:  09/25/21 162 lb (73.5 kg)  04/24/21 172 lb 12.8 oz (78.4 kg)  04/01/21 175 lb 4 oz (79.5 kg)     Health Maintenance Due  Topic Date Due   COLONOSCOPY (Pts 45-10yr Insurance coverage will need to be confirmed)  03/09/2020   Zoster Vaccines- Shingrix (2 of 2) 05/16/2021   COVID-19 Vaccine (2 - Pfizer series) 07/21/2021    There are no preventive care reminders to display for this patient.  No results found for: "TSH" Lab Results  Component Value Date   WBC 10.2 09/25/2021   HGB 14.2 09/25/2021   HCT 40.7 09/25/2021   MCV 96.8 09/25/2021   PLT 226.0 09/25/2021   Lab Results  Component Value Date   NA 140 09/25/2021   K 3.6 09/25/2021   CO2 30 09/25/2021   GLUCOSE 69 (L) 09/25/2021   BUN 5 (L) 09/25/2021   CREATININE 0.85 09/25/2021   BILITOT 0.7 09/25/2021   ALKPHOS 144 (H) 09/25/2021   AST 30 09/25/2021   ALT 26 09/25/2021   PROT 6.7 09/25/2021   ALBUMIN 3.3 (L) 09/25/2021   CALCIUM 9.5 09/25/2021   ANIONGAP 11 04/22/2017   GFR 88.78 09/25/2021   Lab Results  Component Value Date   CHOL 124  09/25/2021   Lab Results  Component  Value Date   HDL 44.50 09/25/2021   Lab Results  Component Value Date   LDLCALC 65 09/25/2021   Lab Results  Component Value Date   TRIG 75.0 09/25/2021   Lab Results  Component Value Date   CHOLHDL 3 09/25/2021   Lab Results  Component Value Date   HGBA1C 5.2 03/07/2021      Assessment & Plan:   Problem List Items Addressed This Visit       Cardiovascular and Mediastinum   Essential hypertension    Continue amlodipine 5 mg  Pt advised of the following:  Continue medication as prescribed. Monitor blood pressure periodically and/or when you feel symptomatic. Goal is <130/90 on average. Ensure that you have rested for 30 minutes prior to checking your blood pressure. Record your readings and bring them to your next visit if necessary.work on a low sodium diet.       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  Sending in anoro 62.5-'25mg'$  inhaler to see if any improvement.       Relevant Medications   umeclidinium-vilanterol (ANORO ELLIPTA) 62.5-25 MCG/ACT AEPB   albuterol (VENTOLIN HFA) 108 (90 Base) MCG/ACT inhaler     Digestive   Cirrhosis of liver without ascites (HCC)    Cirrhosis seen on prior imaging that was reviewed by me today (ordered by FNP in April 2023) , will be ordering CT abd pelvis today for worsening ruq abd pain to r/o further concerns. Will be referring to GI.         Other   HLD (hyperlipidemia)   Relevant Medications   simvastatin (ZOCOR) 20 MG tablet   amLODipine (NORVASC) 5 MG tablet   Other Relevant Orders   Lipid panel (Completed)   Weakness of both lower  extremities    Ongoing. Worsening.  May consider PT now that vascular workup negative.  Pending CT abd pelvis then consider PT       Elevated brain natriuretic peptide (BNP) level    Repeat bnp today pending result, to make sure no increase       Relevant Orders   Brain natriuretic peptide (Completed)   History of colon polyps    Overdue for colonoscopy, referral placed to GI for pt to get his colonoscopy up to date.       Need for influenza vaccination - Primary   Relevant Orders   Flu Vaccine QUAD High Dose(Fluad) (Completed)   Abnormal weight loss    Ct abd pelvis today  Lab workup ordered and pending.  Possibly r/t cirrhosis      Relevant Orders   CT Abdomen Pelvis W Contrast (Completed)   CBC (Completed)   Urinalysis, Routine w reflex microscopic (Completed)   Urine Culture   Comprehensive metabolic panel (Completed)   Right upper quadrant abdominal pain    Moderate, guarding on exam.  Stat Ct abd pelvis today. Pending results.  If any worsening pain pt advised to go to Er.        Relevant Orders   CT Abdomen Pelvis W Contrast (Completed)   Lipase (Completed)  Amylase (Completed)   CBC (Completed)   Elevated LDL cholesterol level   RESOLVED: Encounter for screening colonoscopy   Relevant Orders   Ambulatory referral to Gastroenterology    Meds ordered this encounter  Medications   simvastatin (ZOCOR) 20 MG tablet    Sig: Take 1 tablet (20 mg total) by mouth at bedtime.    Dispense:  90 tablet    Refill:  1   amLODipine (NORVASC) 5 MG tablet    Sig: Take 1 tablet (5 mg total) by mouth daily.    Dispense:  90 tablet    Refill:  1   umeclidinium-vilanterol (ANORO ELLIPTA) 62.5-25 MCG/ACT AEPB    Sig: Inhale 1 puff into the lungs daily.    Dispense:  1 each    Refill:  2    Order Specific Question:   Supervising Provider    Answer:   BEDSOLE, AMY E [[id number]]   albuterol (VENTOLIN HFA) 108 (90 Base) MCG/ACT inhaler    Sig: Inhale 2 puffs into the lungs  every 6 (six) hours as needed for wheezing or shortness of breath.    Dispense:  8 g    Refill:  0    Order Specific Question:   Supervising Provider    Answer:   BEDSOLE, AMY E [[id number]    Follow-up: No follow-ups on file.    Eugenia Pancoast, FNP

## 2021-09-25 NOTE — Telephone Encounter (Signed)
Please let pt know I just sent in the RX for the inhaler.  I sent him in two,   One is called anoro. AS long as covered by insurance I want him to start using it daily this is a maintenance inhaler to help with his emphysema. I also sent in albuterol, this is only WHEN NEEDED.

## 2021-09-25 NOTE — Patient Instructions (Addendum)
Follow up for repeat Cat scan of the chest in October 2023.  Has already been ordered.   A referral was placed today for Gi to set up your colonoscopy.  Please let us know if you have not heard back within 1 week about your referral.

## 2021-09-25 NOTE — Telephone Encounter (Signed)
Patient was seen in office today ,he said that tb mentioned sending in an inhaler for him,but the pharmacy did not have a rx for it. He wants to know did she send it in?

## 2021-09-26 ENCOUNTER — Other Ambulatory Visit: Payer: Self-pay | Admitting: Family

## 2021-09-26 DIAGNOSIS — K7031 Alcoholic cirrhosis of liver with ascites: Secondary | ICD-10-CM

## 2021-09-26 LAB — URINE CULTURE
MICRO NUMBER:: 13950106
SPECIMEN QUALITY:: ADEQUATE

## 2021-09-26 MED ORDER — FUROSEMIDE 20 MG PO TABS: 20.0000 mg | ORAL_TABLET | Freq: Every day | ORAL | 3 refills | Status: DC

## 2021-09-26 MED ORDER — SPIRONOLACTONE 50 MG PO TABS: 50.0000 mg | ORAL_TABLET | Freq: Every day | ORAL | 0 refills | Status: DC

## 2021-09-26 NOTE — Telephone Encounter (Signed)
Patient called back in returning a call he received. Thank you! 

## 2021-09-26 NOTE — Progress Notes (Signed)
Slight increase BNP. This could be fluid accumulating the diuretic I am starting you on will help with this a bit.   There is increase in liver function, likely related to the cirrhosis.  Glucose incidentally on lower end, make sure eating every 2-3 hours to  keep sugar from dropping too far.  Albumin on lower end, also likely from cirrhosis.  Cholesterol ok.

## 2021-09-26 NOTE — Assessment & Plan Note (Signed)
Moderate, guarding on exam.  Stat Ct abd pelvis today. Pending results.  If any worsening pain pt advised to go to Er.

## 2021-09-26 NOTE — Assessment & Plan Note (Signed)
Does appear to have ongoing sob.  Sending in anoro 62.5-'25mg'$  inhaler to see if any improvement.

## 2021-09-26 NOTE — Assessment & Plan Note (Signed)
Repeat bnp today pending result, to make sure no increase

## 2021-09-26 NOTE — Assessment & Plan Note (Addendum)
Ongoing. Worsening.  May consider PT now that vascular workup negative.  Pending CT abd pelvis then consider PT

## 2021-09-26 NOTE — Telephone Encounter (Signed)
Left message to return call to our office.  

## 2021-09-26 NOTE — Assessment & Plan Note (Signed)
Cirrhosis seen on prior imaging that was reviewed by me today (ordered by FNP in April 2023) , will be ordering CT abd pelvis today for worsening ruq abd pain to r/o further concerns. Will be referring to GI.

## 2021-09-26 NOTE — Telephone Encounter (Signed)
Spoke to patent he will pick up tomorrow. He will let us know if not covered or if any questions.

## 2021-09-26 NOTE — Progress Notes (Signed)
Ct abdomen did show nodular liver with cirrhosis and portal hypertension (this is caused from cirrhosis) also seen is some fluid surrounding the liver.  I am going to start pt on a fluid pill that I want him to start over the weekend, and have him follow up in office x one week to repeat potassium. This is very important to follow up next week as potassium can drop easily in one week   I am sending both spironolactone 50 mg and furosemide 20 meq he needs to drink water to to avoid dehydration, and highly suggest he cut down on drinking alcohol to hav eany chance at improving this cirrhosis.   There is also a thoracic (mid back) compression fracture in his back. Is he having any back pain?   There are also tiny gallstones but gallbladder appears ok.

## 2021-09-26 NOTE — Assessment & Plan Note (Signed)
Ct abd pelvis today  Lab workup ordered and pending.  Possibly r/t cirrhosis

## 2021-09-26 NOTE — Assessment & Plan Note (Signed)
Overdue for colonoscopy, referral placed to GI for pt to get his colonoscopy up to date.

## 2021-09-26 NOTE — Assessment & Plan Note (Signed)
Has been evaluated by vascular with negative workup  stable

## 2021-09-26 NOTE — Assessment & Plan Note (Signed)
Continue amlodipine 5 mg  Pt advised of the following:  Continue medication as prescribed. Monitor blood pressure periodically and/or when you feel symptomatic. Goal is <130/90 on average. Ensure that you have rested for 30 minutes prior to checking your blood pressure. Record your readings and bring them to your next visit if necessary.work on a low sodium diet.

## 2021-09-30 ENCOUNTER — Other Ambulatory Visit (INDEPENDENT_AMBULATORY_CARE_PROVIDER_SITE_OTHER): Payer: PPO

## 2021-09-30 ENCOUNTER — Other Ambulatory Visit (INDEPENDENT_AMBULATORY_CARE_PROVIDER_SITE_OTHER): Payer: PPO | Admitting: Family

## 2021-09-30 DIAGNOSIS — Z5181 Encounter for therapeutic drug level monitoring: Secondary | ICD-10-CM

## 2021-09-30 DIAGNOSIS — Z79899 Other long term (current) drug therapy: Secondary | ICD-10-CM | POA: Diagnosis not present

## 2021-09-30 LAB — POTASSIUM: Potassium: 3.1 mEq/L — ABNORMAL LOW (ref 3.5–5.1)

## 2021-09-30 NOTE — Progress Notes (Signed)
Noted  

## 2021-10-01 ENCOUNTER — Other Ambulatory Visit: Payer: Self-pay | Admitting: Family

## 2021-10-01 DIAGNOSIS — E876 Hypokalemia: Secondary | ICD-10-CM

## 2021-10-01 MED ORDER — POTASSIUM CHLORIDE CRYS ER 20 MEQ PO TBCR: 20.0000 meq | EXTENDED_RELEASE_TABLET | Freq: Every day | ORAL | 0 refills | Status: AC

## 2021-10-01 NOTE — Progress Notes (Signed)
Potassium a bit low but we added spironolactone which may increase potassium.  We will send in potassium 20 meq once daily for the next five days.Have pt make appt IN THE OFFICE WITH ME (not a lab only appt) on Monday.

## 2021-10-06 ENCOUNTER — Ambulatory Visit (INDEPENDENT_AMBULATORY_CARE_PROVIDER_SITE_OTHER): Payer: PPO | Admitting: Family

## 2021-10-06 ENCOUNTER — Encounter: Payer: Self-pay | Admitting: *Deleted

## 2021-10-06 ENCOUNTER — Encounter: Payer: Self-pay | Admitting: Family

## 2021-10-06 VITALS — BP 120/62 | HR 104 | Temp 98.3°F | Resp 16 | Ht 66.0 in | Wt 152.2 lb

## 2021-10-06 DIAGNOSIS — F1010 Alcohol abuse, uncomplicated: Secondary | ICD-10-CM | POA: Diagnosis not present

## 2021-10-06 DIAGNOSIS — K7031 Alcoholic cirrhosis of liver with ascites: Secondary | ICD-10-CM | POA: Diagnosis not present

## 2021-10-06 DIAGNOSIS — K7460 Unspecified cirrhosis of liver: Secondary | ICD-10-CM

## 2021-10-06 DIAGNOSIS — K766 Portal hypertension: Secondary | ICD-10-CM

## 2021-10-06 DIAGNOSIS — J439 Emphysema, unspecified: Secondary | ICD-10-CM

## 2021-10-06 DIAGNOSIS — R748 Abnormal levels of other serum enzymes: Secondary | ICD-10-CM | POA: Diagnosis not present

## 2021-10-06 DIAGNOSIS — R7989 Other specified abnormal findings of blood chemistry: Secondary | ICD-10-CM

## 2021-10-06 DIAGNOSIS — K7689 Other specified diseases of liver: Secondary | ICD-10-CM

## 2021-10-06 DIAGNOSIS — E876 Hypokalemia: Secondary | ICD-10-CM | POA: Diagnosis not present

## 2021-10-06 LAB — HEPATIC FUNCTION PANEL
ALT: 16 U/L (ref 0–53)
AST: 36 U/L (ref 0–37)
Albumin: 3.9 g/dL (ref 3.5–5.2)
Alkaline Phosphatase: 177 U/L — ABNORMAL HIGH (ref 39–117)
Bilirubin, Direct: 0.6 mg/dL — ABNORMAL HIGH (ref 0.0–0.3)
Total Bilirubin: 1.9 mg/dL — ABNORMAL HIGH (ref 0.2–1.2)
Total Protein: 7.9 g/dL (ref 6.0–8.3)

## 2021-10-06 LAB — POTASSIUM: Potassium: 3.9 mEq/L (ref 3.5–5.1)

## 2021-10-06 NOTE — Patient Instructions (Signed)
A referral was placed today for hepatology as you have a new diagnosis of cirrhosis.  Please let us know if you have not heard back within 2 weeks about the referral.  Continue with daily inhaler of anoro for your emphysema.   Stop by the lab prior to leaving today. I will notify you of your results once received.   Regards,   Eugenia Pancoast FNP-C

## 2021-10-06 NOTE — Progress Notes (Signed)
Established Patient Office Visit  Subjective:  Patient ID: Bruce Lara, male    DOB: 19-Jun-1950  Age: 71 y.o. MRN: [id number]  CC:  Chief Complaint  Patient presents with   Abnormal Lab    HPI Bruce Lara is here today for follow up.   Pt does state some abdominal pain that radiated from his right upper abdomen to the back of his left upper back. Today states has resolved, however Bruce Lara feels it intermittently. Felt some gas pain, so Bruce Lara took some gas x. Last night without the pain, however two nights before had felt the pain.   Bruce Lara has lost ten pounds in the last 11 days, but Bruce Lara does report Bruce Lara was trying to eat and throwing up which has since resolved.  Wt Readings from Last 3 Encounters:  10/06/21 152 lb 4 oz (69.1 kg)  09/25/21 162 lb (73.5 kg)  04/24/21 172 lb 12.8 oz (78.4 kg)   Bruce Lara does find that his breathing has improved a little bit.   Bruce Lara does state now only about three cans a beer a day now. Bruce Lara tries not to go over three.   Past Medical History:  Diagnosis Date   Alcohol abuse    Allergy    Anxiety    Depression    GERD (gastroesophageal reflux disease)    Hyperlipidemia    Hypertension     No past surgical history on file.  Family History  Problem Relation Age of Onset   Alcohol abuse Father    Hypertension Father    Colon cancer Neg Hx    Esophageal cancer Neg Hx    Liver cancer Neg Hx    Pancreatic cancer Neg Hx    Rectal cancer Neg Hx    Stomach cancer Neg Hx     Social History   Socioeconomic History   Marital status: Widowed    Spouse name: Not on file   Number of children: Not on file   Years of education: Not on file   Highest education level: Not on file  Occupational History   Not on file  Tobacco Use   Smoking status: Every Day    Packs/day: 1.00    Years: 34.00    Total pack years: 34.00    Types: Cigarettes   Smokeless tobacco: Never  Substance and Sexual Activity   Alcohol use: Yes    Alcohol/week: 3.0 standard drinks of  alcohol    Types: 3 Cans of beer per week    Comment: daily---40 oz daily   Drug use: No   Sexual activity: Not Currently    Partners: Female  Other Topics Concern   Not on file  Social History Narrative   Not on file   Social Determinants of Health   Financial Resource Strain: Not on file  Food Insecurity: Not on file  Transportation Needs: Not on file  Physical Activity: Not on file  Stress: Not on file  Social Connections: Not on file  Intimate Partner Violence: Not on file    Outpatient Medications Prior to Visit  Medication Sig Dispense Refill   albuterol (VENTOLIN HFA) 108 (90 Base) MCG/ACT inhaler Inhale 2 puffs into the lungs every 6 (six) hours as needed for wheezing or shortness of breath. 8 g 0   amLODipine (NORVASC) 5 MG tablet Take 1 tablet (5 mg total) by mouth daily. 90 tablet 1   aspirin 325 MG tablet Take 325 mg by mouth daily.     furosemide (  LASIX) 20 MG tablet Take 1 tablet (20 mg total) by mouth daily. 30 tablet 3   Omega-3 Fatty Acids (FISH OIL) 1000 MG CAPS Take by mouth.     potassium chloride SA (KLOR-CON M) 20 MEQ tablet Take 1 tablet (20 mEq total) by mouth daily for 5 days. For low potassium. 5 tablet 0   simvastatin (ZOCOR) 20 MG tablet Take 1 tablet (20 mg total) by mouth at bedtime. 90 tablet 1   spironolactone (ALDACTONE) 50 MG tablet Take 1 tablet (50 mg total) by mouth daily. 30 tablet 0   umeclidinium-vilanterol (ANORO ELLIPTA) 62.5-25 MCG/ACT AEPB Inhale 1 puff into the lungs daily. 1 each 2   No facility-administered medications prior to visit.    No Known Allergies        Objective:    Physical Exam Constitutional:      General: Bruce Lara is not in acute distress.    Appearance: Normal appearance. Bruce Lara is normal weight. Bruce Lara is not ill-appearing, toxic-appearing or diaphoretic.  Cardiovascular:     Rate and Rhythm: Normal rate and regular rhythm.     Pulses: Normal pulses.     Heart sounds: Normal heart sounds.  Pulmonary:     Effort:  Pulmonary effort is normal.     Breath sounds: Normal breath sounds.  Abdominal:     General: Abdomen is flat. Bowel sounds are normal.     Tenderness: There is abdominal tenderness in the right upper quadrant and epigastric area. Positive signs include Murphy's sign.     Hernia: No hernia is present.  Skin:    General: Skin is warm.  Neurological:     General: No focal deficit present.     Mental Status: Bruce Lara is alert and oriented to person, place, and time. Mental status is at baseline.  Psychiatric:        Mood and Affect: Mood normal.        Behavior: Behavior normal.        Thought Content: Thought content normal.        Judgment: Judgment normal.     BP 120/62   Pulse (!) 104   Temp 98.3 F (36.8 C)   Resp 16   Ht '5\' 6"'$  (1.676 m)   Wt 152 lb 4 oz (69.1 kg)   SpO2 98%   BMI 24.57 kg/m  Wt Readings from Last 3 Encounters:  10/06/21 152 lb 4 oz (69.1 kg)  09/25/21 162 lb (73.5 kg)  04/24/21 172 lb 12.8 oz (78.4 kg)     Health Maintenance Due  Topic Date Due   COLONOSCOPY (Pts 45-32yr Insurance coverage will need to be confirmed)  03/09/2020   COVID-19 Vaccine (2 - Pfizer risk series) 04/11/2021   Zoster Vaccines- Shingrix (2 of 2) 05/16/2021    There are no preventive care reminders to display for this patient.  No results found for: "TSH" Lab Results  Component Value Date   WBC 10.2 09/25/2021   HGB 14.2 09/25/2021   HCT 40.7 09/25/2021   MCV 96.8 09/25/2021   PLT 226.0 09/25/2021   Lab Results  Component Value Date   NA 140 09/25/2021   K 3.1 (L) 09/30/2021   CO2 30 09/25/2021   GLUCOSE 69 (L) 09/25/2021   BUN 5 (L) 09/25/2021   CREATININE 0.85 09/25/2021   BILITOT 0.7 09/25/2021   ALKPHOS 144 (H) 09/25/2021   AST 30 09/25/2021   ALT 26 09/25/2021   PROT 6.7 09/25/2021   ALBUMIN 3.3 (L) 09/25/2021  CALCIUM 9.5 09/25/2021   ANIONGAP 11 04/22/2017   GFR 88.78 09/25/2021   Lab Results  Component Value Date   CHOL 124 09/25/2021   Lab  Results  Component Value Date   HDL 44.50 09/25/2021   Lab Results  Component Value Date   LDLCALC 65 09/25/2021   Lab Results  Component Value Date   TRIG 75.0 09/25/2021   Lab Results  Component Value Date   CHOLHDL 3 09/25/2021   Lab Results  Component Value Date   HGBA1C 5.2 03/07/2021      Assessment & Plan:   Problem List Items Addressed This Visit       Cardiovascular and Mediastinum   Portal hypertension ([place])   Relevant Orders   Amb Referral to Hepatology     Respiratory   Pulmonary emphysema ([place])    Advised patient to continue with Anoro as it has been helping him with his shortness of breath        Digestive   Nodular hyperplasia of liver   Relevant Orders   Amb Referral to Hepatology   Alcoholic cirrhosis of liver with ascites ([place])    On exam with abdominal tenderness Referral placed for hepatology for evaluation and treatment continue daily furosemide and spironolactone Checking for potassium levels today to see if needing more potassium supplementation Did advise patient that Bruce Lara should decrease his alcohol intake and/or completely stop would be preferable      RESOLVED: Cirrhosis of liver without ascites ([place])   Relevant Orders   Amb Referral to Hepatology     Other   Alcohol abuse    Discussed patient on cutting down on beer with the end goal being quitting completely      Elevated liver function tests    Repeat labs today pending results      Hypokalemia - Primary   Relevant Orders   Potassium   Elevated alkaline phosphatase level   Relevant Orders   Hepatic Function Panel    No orders of the defined types were placed in this encounter.   Follow-up: Return in about 1 month (around 11/06/2021) for f/u emphysema.    Eugenia Pancoast, FNP

## 2021-10-06 NOTE — Assessment & Plan Note (Signed)
Repeat labs today pending results.  

## 2021-10-06 NOTE — Assessment & Plan Note (Signed)
Advised patient to continue with Anoro as it has been helping him with his shortness of breath

## 2021-10-06 NOTE — Assessment & Plan Note (Signed)
On exam with abdominal tenderness Referral placed for hepatology for evaluation and treatment continue daily furosemide and spironolactone Checking for potassium levels today to see if needing more potassium supplementation Did advise patient that he should decrease his alcohol intake and/or completely stop would be preferable

## 2021-10-06 NOTE — Assessment & Plan Note (Signed)
Discussed patient on cutting down on beer with the end goal being quitting completely

## 2021-10-07 ENCOUNTER — Encounter: Payer: Self-pay | Admitting: Family

## 2021-10-07 NOTE — Progress Notes (Signed)
Liver tests are trending upwards With abdominal pain and new finding cirrhosis this is concerning.  Trying to get him in with hepatology, if you are able to reach him please let him know his phone has been turned off or disconnected because we have been unable to reach him in regards to referral. We need him accessible to make this appt. Please call his home #

## 2021-10-22 ENCOUNTER — Other Ambulatory Visit: Payer: Self-pay | Admitting: Family

## 2021-10-22 DIAGNOSIS — K7031 Alcoholic cirrhosis of liver with ascites: Secondary | ICD-10-CM

## 2021-11-03 ENCOUNTER — Encounter (INDEPENDENT_AMBULATORY_CARE_PROVIDER_SITE_OTHER): Payer: Self-pay

## 2021-11-17 ENCOUNTER — Telehealth: Payer: Self-pay | Admitting: Family

## 2021-11-17 NOTE — Telephone Encounter (Signed)
LVM for pt to rtn my call to schedule AWV-I with [facility] call back # [phone] 

## 2021-11-25 ENCOUNTER — Inpatient Hospital Stay: Admission: RE | Admit: 2021-11-25 | Payer: PPO | Source: Ambulatory Visit

## 2021-11-29 ENCOUNTER — Other Ambulatory Visit: Payer: Self-pay | Admitting: Family

## 2021-11-29 DIAGNOSIS — K7031 Alcoholic cirrhosis of liver with ascites: Secondary | ICD-10-CM

## 2021-12-05 DIAGNOSIS — K7031 Alcoholic cirrhosis of liver with ascites: Secondary | ICD-10-CM | POA: Diagnosis not present

## 2021-12-05 DIAGNOSIS — F1010 Alcohol abuse, uncomplicated: Secondary | ICD-10-CM | POA: Diagnosis not present

## 2021-12-05 DIAGNOSIS — K766 Portal hypertension: Secondary | ICD-10-CM | POA: Diagnosis not present

## 2021-12-06 ENCOUNTER — Other Ambulatory Visit: Payer: Self-pay | Admitting: Family

## 2021-12-06 DIAGNOSIS — K7031 Alcoholic cirrhosis of liver with ascites: Secondary | ICD-10-CM

## 2021-12-09 NOTE — Telephone Encounter (Signed)
Please call pt as overdue for appt and labs

## 2021-12-11 NOTE — Telephone Encounter (Signed)
Spoke to pt, scheduled f/u for 12/17/21

## 2021-12-11 NOTE — Telephone Encounter (Signed)
Please call and schedule office visit with Tabitha.  Once schedule please send back to her.

## 2021-12-17 ENCOUNTER — Ambulatory Visit: Payer: PPO | Admitting: Family

## 2021-12-25 NOTE — Progress Notes (Signed)
Canceled appointment.

## 2021-12-30 ENCOUNTER — Ambulatory Visit (INDEPENDENT_AMBULATORY_CARE_PROVIDER_SITE_OTHER): Payer: PPO

## 2021-12-30 VITALS — Ht 66.0 in | Wt 152.0 lb

## 2021-12-30 DIAGNOSIS — Z0000 Encounter for general adult medical examination without abnormal findings: Secondary | ICD-10-CM

## 2021-12-30 NOTE — Progress Notes (Signed)
Subjective:   Bruce Lara is a 71 y.o. male who presents for an Initial Medicare Annual Wellness Visit.  Review of Systems    No ROS.  Medicare Wellness Virtual Visit.  Visual/audio telehealth visit, UTA vital signs.   See social history for additional risk factors.   Cardiac Risk Factors include: advanced age (>54mn, >>10women);male gender;hypertension     Objective:    Today's Vitals   12/30/21 1313  Weight: 152 lb (68.9 kg)  Height: '5\' 6"'$  (1.676 m)   Body mass index is 24.53 kg/m.     12/30/2021    1:18 PM  Advanced Directives  Does Patient Have a Medical Advance Directive? No  Would patient like information on creating a medical advance directive? No - Patient declined    Current Medications (verified) Outpatient Encounter Medications as of 12/30/2021  Medication Sig   albuterol (VENTOLIN HFA) 108 (90 Base) MCG/ACT inhaler Inhale 2 puffs into the lungs every 6 (six) hours as needed for wheezing or shortness of breath.   amLODipine (NORVASC) 5 MG tablet Take 1 tablet (5 mg total) by mouth daily.   aspirin 325 MG tablet Take 325 mg by mouth daily.   furosemide (LASIX) 20 MG tablet Take 1 tablet (20 mg total) by mouth daily.   Omega-3 Fatty Acids (FISH OIL) 1000 MG CAPS Take by mouth.   potassium chloride SA (KLOR-CON M) 20 MEQ tablet Take 1 tablet (20 mEq total) by mouth daily for 5 days. For low potassium.   simvastatin (ZOCOR) 20 MG tablet Take 1 tablet (20 mg total) by mouth at bedtime.   spironolactone (ALDACTONE) 50 MG tablet Take 1 tablet by mouth once daily   umeclidinium-vilanterol (ANORO ELLIPTA) 62.5-25 MCG/ACT AEPB Inhale 1 puff into the lungs daily.   No facility-administered encounter medications on file as of 12/30/2021.    Allergies (verified) Patient has no known allergies.   History: Past Medical History:  Diagnosis Date   Alcohol abuse    Allergy    Anxiety    Depression    GERD (gastroesophageal reflux disease)    Hyperlipidemia     Hypertension    History reviewed. No pertinent surgical history. Family History  Problem Relation Age of Onset   Alcohol abuse Father    Hypertension Father    Colon cancer Neg Hx    Esophageal cancer Neg Hx    Liver cancer Neg Hx    Pancreatic cancer Neg Hx    Rectal cancer Neg Hx    Stomach cancer Neg Hx    Social History   Socioeconomic History   Marital status: Widowed    Spouse name: Not on file   Number of children: Not on file   Years of education: Not on file   Highest education level: Not on file  Occupational History   Not on file  Tobacco Use   Smoking status: Every Day    Packs/day: 1.00    Years: 34.00    Total pack years: 34.00    Types: Cigarettes   Smokeless tobacco: Never  Substance and Sexual Activity   Alcohol use: Yes    Alcohol/week: 3.0 standard drinks of alcohol    Types: 3 Cans of beer per week    Comment: daily---40 oz daily   Drug use: No   Sexual activity: Not Currently    Partners: Female  Other Topics Concern   Not on file  Social History Narrative   Not on file   Social Determinants  of Health   Financial Resource Strain: Low Risk  (12/30/2021)   Overall Financial Resource Strain (CARDIA)    Difficulty of Paying Living Expenses: Not hard at all  Food Insecurity: No Food Insecurity (12/30/2021)   Hunger Vital Sign    Worried About Running Out of Food in the Last Year: Never true    Ran Out of Food in the Last Year: Never true  Transportation Needs: No Transportation Needs (12/30/2021)   PRAPARE - Hydrologist (Medical): No    Lack of Transportation (Non-Medical): No  Physical Activity: Not on file  Stress: No Stress Concern Present (12/30/2021)   [place]    Feeling of Stress : Not at all  Social Connections: Unknown (12/30/2021)   Social Connection and Isolation Panel [NHANES]    Frequency of Communication with Friends and Family:  More than three times a week    Frequency of Social Gatherings with Friends and Family: More than three times a week    Attends Religious Services: Not on Advertising copywriter or Organizations: Not on file    Attends Archivist Meetings: Not on file    Marital Status: Not on file    Tobacco Counseling Ready to quit: Not Answered Counseling given: Not Answered   Clinical Intake:  Pre-visit preparation completed: Yes        Diabetes: No  How often do you need to have someone help you when you read instructions, pamphlets, or other written materials from your doctor or pharmacy?: 1 - Never    Interpreter Needed?: No     Activities of Daily Living    12/30/2021    1:19 PM  In your present state of health, do you have any difficulty performing the following activities:  Hearing? 0  Vision? 0  Difficulty concentrating or making decisions? 0  Walking or climbing stairs? 0  Comment Paces self when ambulating. Cane in use when as needed.  Dressing or bathing? 0  Doing errands, shopping? 0  Preparing Food and eating ? N  Using the Toilet? N  In the past six months, have you accidently leaked urine? N  Do you have problems with loss of bowel control? N  Managing your Medications? N  Managing your Finances? N  Housekeeping or managing your Housekeeping? N    Patient Care Team: Eugenia Pancoast, FNP as PCP - General (Family Medicine)  Indicate any recent Medical Services you may have received from other than [facility] providers in the past year (date may be approximate).     Assessment:   This is a routine wellness examination for Bruce Lara.  I connected with  Bruce Lara on 12/30/21 by a audio enabled telemedicine application and verified that I am speaking with the correct person using two identifiers.  Patient Location: Home  Provider Location: Office/Clinic  I discussed the limitations of evaluation and management by telemedicine. The patient  expressed understanding and agreed to proceed.   Hearing/Vision screen Hearing Screening - Comments:: Patient is able to hear conversational tones without difficulty.  No issues reported.   Vision Screening - Comments:: Wears corrective lenses when reading They have seen their ophthalmologist in the last 12 months.    Dietary issues and exercise activities discussed: Current Exercise Habits: Home exercise routine, Type of exercise: stretching, Intensity: Mild Regular diet   Goals Addressed  This Visit's Progress    Maintain healthy lifestyle       Stay active Healthy diet       Depression Screen    12/30/2021    1:16 PM 09/25/2021   10:54 AM 04/03/2020    2:32 PM 01/01/2017    3:19 PM 06/11/2015    2:29 PM 12/11/2014    2:31 PM 06/11/2014    3:46 PM  PHQ 2/9 Scores  PHQ - 2 Score 0 0 0 0 0 0 0    Fall Risk    12/30/2021    1:19 PM 09/25/2021   10:53 AM 08/04/2018    4:29 PM 06/11/2015    2:29 PM 12/11/2014    2:31 PM  [place] in the past year? 0 1  No No  Comment   Emmi Telephone Survey: data to providers prior to load    Number falls in past yr: 0 0     Comment   Emmi Telephone Survey Actual Response =     Injury with Fall? 0 1     Risk for fall due to :  No Fall Risks     Risk for fall due to: Comment Cane in use as needed      Follow up Falls evaluation completed;Falls prevention discussed;Education provided        FALL RISK PREVENTION PERTAINING TO THE HOME: Home free of loose throw rugs in walkways, pet beds, electrical cords, etc? Yes  Adequate lighting in your home to reduce risk of falls? Yes   ASSISTIVE DEVICES UTILIZED TO PREVENT FALLS: Use of a cane, walker or w/c? Yes , as needed  TIMED UP AND GO: Was the test performed? No .   Cognitive Function:        12/30/2021    1:21 PM  6CIT Screen  What Year? 0 points  What month? 0 points  What time? 0 points  Count back from 20 0 points  Months in reverse 0 points  Repeat  phrase 0 points  Total Score 0 points    Immunizations Immunization History  Administered Date(s) Administered   Fluad Quad(high Dose 65+) 09/25/2021   Influenza,inj,Quad PF,6+ Mos 12/11/2014, 12/12/2015   PNEUMOCOCCAL CONJUGATE-20 03/21/2021   Pfizer Covid-19 Vaccine Bivalent Booster 5y-11y 03/21/2021   Pneumococcal Polysaccharide-23 02/07/2013   Td 03/21/2021   Tdap 03/07/2013   Zoster Recombinat (Shingrix) 03/21/2021   Qualifies for Shingles Vaccine? Agrees to update immunization record upon second completion.   Screening Tests Health Maintenance  Topic Date Due   COVID-19 Vaccine (2 - Pfizer risk series) 01/15/2022 (Originally 04/11/2021)   COLONOSCOPY (Pts 45-30yr Insurance coverage will need to be confirmed)  03/06/2022 (Originally 03/09/2020)   Zoster Vaccines- Shingrix (2 of 2) 03/31/2022 (Originally 05/16/2021)   Lung Cancer Screening  04/15/2022   Medicare Annual Wellness (AWV)  12/31/2022   DTaP/Tdap/Td (3 - Td or Tdap) 03/22/2031   Pneumonia Vaccine 71 Years old  Completed   INFLUENZA VACCINE  Completed   Hepatitis C Screening  Completed   HPV VACCINES  Aged Out    Health Maintenance There are no preventive care reminders to display for this patient.  Colonoscopy- deferred per patient preference.   Lung Cancer Screening: completed 04/14/21.   Hepatitis C Screening: Completed 12/2014.  Vision Screening: Recommended annual ophthalmology exams for early detection of glaucoma and other disorders of the eye.  Dental Screening: Recommended annual dental exams for proper oral hygiene  Community Resource Referral / Chronic Care  Management: CRR required this visit?  No   CCM required this visit?  No      Plan:     I have personally reviewed and noted the following in the patient's chart:   Medical and social history Use of alcohol, tobacco or illicit drugs  Current medications and supplements including opioid prescriptions. Patient is not currently taking  opioid prescriptions. Functional ability and status Nutritional status Physical activity Advanced directives List of other physicians Hospitalizations, surgeries, and ER visits in previous 12 months Vitals Screenings to include cognitive, depression, and falls Referrals and appointments  In addition, I have reviewed and discussed with patient certain preventive protocols, quality metrics, and best practice recommendations. A written personalized care plan for preventive services as well as general preventive health recommendations were provided to patient.     Leta Jungling, LPN   [id number]

## 2021-12-30 NOTE — Patient Instructions (Addendum)
Bruce Lara , Thank you for taking time to come for your Medicare Wellness Visit. I appreciate your ongoing commitment to your health goals. Please review the following plan we discussed and let me know if I can assist you in the future.   These are the goals we discussed:  Goals      Maintain healthy lifestyle     Stay active Healthy diet        This is a list of the screening recommended for you and due dates:  Health Maintenance  Topic Date Due   COVID-19 Vaccine (2 - Pfizer risk series) 01/15/2022*   Colon Cancer Screening  03/06/2022*   Zoster (Shingles) Vaccine (2 of 2) 03/31/2022*   Screening for Lung Cancer  04/15/2022   Medicare Annual Wellness Visit  12/31/2022   DTaP/Tdap/Td vaccine (3 - Td or Tdap) 03/22/2031   Pneumonia Vaccine  Completed   Flu Shot  Completed   Hepatitis C Screening: USPSTF Recommendation to screen - Ages 18-79 yo.  Completed   HPV Vaccine  Aged Out  *Topic was postponed. The date shown is not the original due date.    Conditions/risks identified: none new  Next appointment: Follow up in one year for your annual wellness visit.   Preventive Care 3 Years and Older, Male  Preventive care refers to lifestyle choices and visits with your health care provider that can promote health and wellness. What does preventive care include? A yearly physical exam. This is also called an annual well check. Dental exams once or twice a year. Routine eye exams. Ask your health care provider how often you should have your eyes checked. Personal lifestyle choices, including: Daily care of your teeth and gums. Regular physical activity. Eating a healthy diet. Avoiding tobacco and drug use. Limiting alcohol use. Practicing safe sex. Taking low doses of aspirin every day. Taking vitamin and mineral supplements as recommended by your health care provider. What happens during an annual well check? The services and screenings done by your health care provider  during your annual well check will depend on your age, overall health, lifestyle risk factors, and family history of disease. Counseling  Your health care provider may ask you questions about your: Alcohol use. Tobacco use. Drug use. Emotional well-being. Home and relationship well-being. Sexual activity. Eating habits. History of falls. Memory and ability to understand (cognition). Work and work Statistician. Screening  You may have the following tests or measurements: Height, weight, and BMI. Blood pressure. Lipid and cholesterol levels. These may be checked every 5 years, or more frequently if you are over 61 years old. Skin check. Lung cancer screening. You may have this screening every year starting at age 91 if you have a 30-pack-year history of smoking and currently smoke or have quit within the past 15 years. Fecal occult blood test (FOBT) of the stool. You may have this test every year starting at age 47. Flexible sigmoidoscopy or colonoscopy. You may have a sigmoidoscopy every 5 years or a colonoscopy every 10 years starting at age 67. Prostate cancer screening. Recommendations will vary depending on your family history and other risks. Hepatitis C blood test. Hepatitis B blood test. Sexually transmitted disease (STD) testing. Diabetes screening. This is done by checking your blood sugar (glucose) after you have not eaten for a while (fasting). You may have this done every 1-3 years. Abdominal aortic aneurysm (AAA) screening. You may need this if you are a current or former smoker. Osteoporosis. You may be  screened starting at age 72 if you are at high risk. Talk with your health care provider about your test results, treatment options, and if necessary, the need for more tests. Vaccines  Your health care provider may recommend certain vaccines, such as: Influenza vaccine. This is recommended every year. Tetanus, diphtheria, and acellular pertussis (Tdap, Td) vaccine. You  may need a Td booster every 10 years. Zoster vaccine. You may need this after age 50. Pneumococcal 13-valent conjugate (PCV13) vaccine. One dose is recommended after age 37. Pneumococcal polysaccharide (PPSV23) vaccine. One dose is recommended after age 47. Talk to your health care provider about which screenings and vaccines you need and how often you need them. This information is not intended to replace advice given to you by your health care provider. Make sure you discuss any questions you have with your health care provider. Document Released: 01/18/2015 Document Revised: 09/11/2015 Document Reviewed: 10/23/2014 Elsevier Interactive Patient Education  2017 [place] Prevention in the Home Falls can cause injuries. They can happen to people of all ages. There are many things you can do to make your home safe and to help prevent falls. What can I do on the outside of my home? Regularly fix the edges of walkways and driveways and fix any cracks. Remove anything that might make you trip as you walk through a door, such as a raised step or threshold. Trim any bushes or trees on the path to your home. Use bright outdoor lighting. Clear any walking paths of anything that might make someone trip, such as rocks or tools. Regularly check to see if handrails are loose or broken. Make sure that both sides of any steps have handrails. Any raised decks and porches should have guardrails on the edges. Have any leaves, snow, or ice cleared regularly. Use sand or salt on walking paths during winter. Clean up any spills in your garage right away. This includes oil or grease spills. What can I do in the bathroom? Use night lights. Install grab bars by the toilet and in the tub and shower. Do not use towel bars as grab bars. Use non-skid mats or decals in the tub or shower. If you need to sit down in the shower, use a plastic, non-slip stool. Keep the floor dry. Clean up any water that spills  on the floor as soon as it happens. Remove soap buildup in the tub or shower regularly. Attach bath mats securely with double-sided non-slip rug tape. Do not have throw rugs and other things on the floor that can make you trip. What can I do in the bedroom? Use night lights. Make sure that you have a light by your bed that is easy to reach. Do not use any sheets or blankets that are too big for your bed. They should not hang down onto the floor. Have a firm chair that has side arms. You can use this for support while you get dressed. Do not have throw rugs and other things on the floor that can make you trip. What can I do in the kitchen? Clean up any spills right away. Avoid walking on wet floors. Keep items that you use a lot in easy-to-reach places. If you need to reach something above you, use a strong step stool that has a grab bar. Keep electrical cords out of the way. Do not use floor polish or wax that makes floors slippery. If you must use wax, use non-skid floor wax. Do not have  throw rugs and other things on the floor that can make you trip. What can I do with my stairs? Do not leave any items on the stairs. Make sure that there are handrails on both sides of the stairs and use them. Fix handrails that are broken or loose. Make sure that handrails are as long as the stairways. Check any carpeting to make sure that it is firmly attached to the stairs. Fix any carpet that is loose or worn. Avoid having throw rugs at the top or bottom of the stairs. If you do have throw rugs, attach them to the floor with carpet tape. Make sure that you have a light switch at the top of the stairs and the bottom of the stairs. If you do not have them, ask someone to add them for you. What else can I do to help prevent falls? Wear shoes that: Do not have high heels. Have rubber bottoms. Are comfortable and fit you well. Are closed at the toe. Do not wear sandals. If you use a stepladder: Make  sure that it is fully opened. Do not climb a closed stepladder. Make sure that both sides of the stepladder are locked into place. Ask someone to hold it for you, if possible. Clearly mark and make sure that you can see: Any grab bars or handrails. First and last steps. Where the edge of each step is. Use tools that help you move around (mobility aids) if they are needed. These include: Canes. Walkers. Scooters. Crutches. Turn on the lights when you go into a dark area. Replace any light bulbs as soon as they burn out. Set up your furniture so you have a clear path. Avoid moving your furniture around. If any of your floors are uneven, fix them. If there are any pets around you, be aware of where they are. Review your medicines with your doctor. Some medicines can make you feel dizzy. This can increase your chance of falling. Ask your doctor what other things that you can do to help prevent falls. This information is not intended to replace advice given to you by your health care provider. Make sure you discuss any questions you have with your health care provider. Document Released: 10/18/2008 Document Revised: 05/30/2015 Document Reviewed: 01/26/2014 Elsevier Interactive Patient Education  2017 [employer].

## 2022-01-01 ENCOUNTER — Other Ambulatory Visit: Payer: Self-pay | Admitting: Family

## 2022-01-01 ENCOUNTER — Ambulatory Visit (INDEPENDENT_AMBULATORY_CARE_PROVIDER_SITE_OTHER): Payer: Self-pay | Admitting: Physician Assistant

## 2022-01-01 DIAGNOSIS — J439 Emphysema, unspecified: Secondary | ICD-10-CM

## 2022-01-01 DIAGNOSIS — K7031 Alcoholic cirrhosis of liver with ascites: Secondary | ICD-10-CM

## 2022-01-09 ENCOUNTER — Telehealth: Payer: Self-pay

## 2022-01-09 ENCOUNTER — Encounter: Payer: Self-pay | Admitting: Family

## 2022-01-09 ENCOUNTER — Ambulatory Visit (INDEPENDENT_AMBULATORY_CARE_PROVIDER_SITE_OTHER): Payer: PPO | Admitting: Family

## 2022-01-09 VITALS — BP 122/72 | HR 89 | Wt 158.0 lb

## 2022-01-09 DIAGNOSIS — J439 Emphysema, unspecified: Secondary | ICD-10-CM | POA: Diagnosis not present

## 2022-01-09 DIAGNOSIS — K7031 Alcoholic cirrhosis of liver with ascites: Secondary | ICD-10-CM | POA: Diagnosis not present

## 2022-01-09 DIAGNOSIS — I10 Essential (primary) hypertension: Secondary | ICD-10-CM

## 2022-01-09 DIAGNOSIS — F1010 Alcohol abuse, uncomplicated: Secondary | ICD-10-CM

## 2022-01-09 MED ORDER — FUROSEMIDE 20 MG PO TABS: 20.0000 mg | ORAL_TABLET | Freq: Every day | ORAL | 1 refills | Status: AC

## 2022-01-09 MED ORDER — SPIRONOLACTONE 50 MG PO TABS: 50.0000 mg | ORAL_TABLET | Freq: Every day | ORAL | 1 refills | Status: AC

## 2022-01-09 NOTE — Assessment & Plan Note (Signed)
Stable. Continue daily amlodipine 5 mg

## 2022-01-09 NOTE — Assessment & Plan Note (Signed)
Going to continue on spironolactone 50 mg and furosemide 20 mg once daily.  Continue f/u with hepatology as scheduled.  Advised again to work on cutting down alcohol intake, goal is complete cessation. Pt states he will work on this. Advised pt he is not a transplant candidate without complete cessation, he is aware.

## 2022-01-09 NOTE — Assessment & Plan Note (Signed)
Smoking cessation instruction/counseling given:  counseled patient on the dangers of tobacco use, advised patient to stop smoking, and reviewed strategies to maximize success 

## 2022-01-09 NOTE — Assessment & Plan Note (Signed)
Continue anoro as prescribed, will consider adjunct if necessary.  Albuterol prn.

## 2022-01-09 NOTE — Progress Notes (Unsigned)
Established Patient Office Visit  Subjective:  Patient ID: Bruce Lara, male    DOB: [birth year]  Age: 72 y.o. MRN: [id number]  CC:  Chief Complaint  Patient presents with   Follow-up    Needs endoscopy and HepB vax    HPI Bruce Lara is here today for follow up.   Pt is with acute concerns.  Emphysema and left lower lobe 6.2 mm mass lungs.   Alcoholic cirrhosis: recommendation for EGD with Dr. Beverley Fiedler. Was advised that [facility] would contact him to scheduled Egd. Still taking furosemide 20 mg and spironolactone 50 mg once daily. Weight loss of 20 pounds. Not candidate for liver transplant due to ongoing alochol use. HAV and HBV vaccination recommended if no evidence of immunity. Highly recommended intensive outpatient program for alcohol use. Has f/u 03/18/22   Titer: positive for immunity to hepatitis A , negative immunity to hep B.  Will need hepatitis B vaccination.   Today pt states he is cutting down on alcohol use. States has not had any beer today. Has cut down to about two years a few times a week, maybe 3-4 days a week.   Had appt with Gi 12/28 but rescheduled as he was feeling sick on his stomach. He feels better now and will make follow up appointment with them.  Wt Readings from Last 3 Encounters:  01/09/22 158 lb (71.7 kg)  12/30/21 152 lb (68.9 kg)  10/06/21 152 lb 4 oz (69.1 kg)   Hyperlipidemia: taking simvastatin, doing well. Controlled.  Lab Results  Component Value Date   CHOL 124 09/25/2021   HDL 44.50 09/25/2021   LDLCALC 65 09/25/2021   LDLDIRECT 122.0 06/11/2015   TRIG 75.0 09/25/2021   CHOLHDL 3 09/25/2021   Emphysema: using albuterol once every other day, still taking anoro daily.    Past Medical History:  Diagnosis Date   Alcohol abuse    Allergy    Anxiety    Depression    GERD (gastroesophageal reflux disease)    Hyperlipidemia    Hypertension     History reviewed. No pertinent surgical history.  Family History  Problem  Relation Age of Onset   Alcohol abuse Father    Hypertension Father    Colon cancer Neg Hx    Esophageal cancer Neg Hx    Liver cancer Neg Hx    Pancreatic cancer Neg Hx    Rectal cancer Neg Hx    Stomach cancer Neg Hx     Social History   Socioeconomic History   Marital status: Widowed    Spouse name: Not on file   Number of children: Not on file   Years of education: Not on file   Highest education level: Not on file  Occupational History   Not on file  Tobacco Use   Smoking status: Every Day    Packs/day: 1.00    Years: 34.00    Total pack years: 34.00    Types: Cigarettes   Smokeless tobacco: Never  Substance and Sexual Activity   Alcohol use: Yes    Alcohol/week: 3.0 standard drinks of alcohol    Types: 3 Cans of beer per week    Comment: daily---40 oz daily   Drug use: No   Sexual activity: Not Currently    Partners: Female  Other Topics Concern   Not on file  Social History Narrative   Not on file   Social Determinants of Health   Financial Resource Strain: Low Risk  (  12/30/2021)   Overall Financial Resource Strain (CARDIA)    Difficulty of Paying Living Expenses: Not hard at all  Food Insecurity: No Food Insecurity (12/30/2021)   Hunger Vital Sign    Worried About Running Out of Food in the Last Year: Never true    Ran Out of Food in the Last Year: Never true  Transportation Needs: No Transportation Needs (12/30/2021)   PRAPARE - Hydrologist (Medical): No    Lack of Transportation (Non-Medical): No  Physical Activity: Not on file  Stress: No Stress Concern Present (12/30/2021)   [place]    Feeling of Stress : Not at all  Social Connections: Unknown (12/30/2021)   Social Connection and Isolation Panel [NHANES]    Frequency of Communication with Friends and Family: More than three times a week    Frequency of Social Gatherings with Friends and Family:  More than three times a week    Attends Religious Services: Not on file    Active Member of [place] or Organizations: Not on file    Attends Archivist Meetings: Not on file    Marital Status: Not on file  Intimate Partner Violence: Not At Risk (12/30/2021)   Humiliation, Afraid, Rape, and Kick questionnaire    Fear of Current or Ex-Partner: No    Emotionally Abused: No    Physically Abused: No    Sexually Abused: No    Outpatient Medications Prior to Visit  Medication Sig Dispense Refill   albuterol (VENTOLIN HFA) 108 (90 Base) MCG/ACT inhaler INHALE 2 PUFFS BY MOUTH EVERY 6 HOURS AS NEEDED FOR WHEEZING FOR SHORTNESS OF BREATH 9 g 0   amLODipine (NORVASC) 5 MG tablet Take 1 tablet (5 mg total) by mouth daily. 90 tablet 1   aspirin 325 MG tablet Take 325 mg by mouth daily.     furosemide (LASIX) 20 MG tablet Take 1 tablet (20 mg total) by mouth daily. 30 tablet 3   Omega-3 Fatty Acids (FISH OIL) 1000 MG CAPS Take by mouth.     simvastatin (ZOCOR) 20 MG tablet Take 1 tablet (20 mg total) by mouth at bedtime. 90 tablet 1   spironolactone (ALDACTONE) 50 MG tablet Take 1 tablet by mouth once daily 30 tablet 0   umeclidinium-vilanterol (ANORO ELLIPTA) 62.5-25 MCG/ACT AEPB Inhale 1 puff into the lungs daily. 1 each 2   potassium chloride SA (KLOR-CON M) 20 MEQ tablet Take 1 tablet (20 mEq total) by mouth daily for 5 days. For low potassium. 5 tablet 0   No facility-administered medications prior to visit.    No Known Allergies    Review of Systems  Respiratory:  Negative for shortness of breath.   Cardiovascular:  Negative for chest pain and palpitations.  Gastrointestinal:  Negative for constipation and diarrhea.  Genitourinary:  Negative for dysuria, frequency and urgency.  Musculoskeletal:  Negative for myalgias.  Psychiatric/Behavioral:  Negative for depression and suicidal ideas.   All other systems reviewed and are negative.    Objective:    Physical Exam  Gen:  NAD, resting comfortably HEENT: TMs normal bilaterally. OP clear. No thyromegaly noted.  CV: RRR with no murmurs appreciated Pulm: NWOB, CTAB with no crackles, wheezes, or rhonchi GI: Normal bowel sounds present. Soft, Nontender, Nondistended. MSK: no edema, cyanosis, or clubbing noted Skin: warm, dry Psych: Normal affect and thought content  BP 122/72   Pulse 89   Wt 158 lb (71.7  kg)   SpO2 99%   BMI 25.50 kg/m  Wt Readings from Last 3 Encounters:  01/09/22 158 lb (71.7 kg)  12/30/21 152 lb (68.9 kg)  10/06/21 152 lb 4 oz (69.1 kg)     There are no preventive care reminders to display for this patient.  There are no preventive care reminders to display for this patient.  No results found for: "TSH" Lab Results  Component Value Date   WBC 10.2 09/25/2021   HGB 14.2 09/25/2021   HCT 40.7 09/25/2021   MCV 96.8 09/25/2021   PLT 226.0 09/25/2021   Lab Results  Component Value Date   NA 140 09/25/2021   K 3.9 10/06/2021   CO2 30 09/25/2021   GLUCOSE 69 (L) 09/25/2021   BUN 5 (L) 09/25/2021   CREATININE 0.85 09/25/2021   BILITOT 1.9 (H) 10/06/2021   ALKPHOS 177 (H) 10/06/2021   AST 36 10/06/2021   ALT 16 10/06/2021   PROT 7.9 10/06/2021   ALBUMIN 3.9 10/06/2021   CALCIUM 9.5 09/25/2021   ANIONGAP 11 04/22/2017   GFR 88.78 09/25/2021   Lab Results  Component Value Date   CHOL 124 09/25/2021   Lab Results  Component Value Date   HDL 44.50 09/25/2021   Lab Results  Component Value Date   LDLCALC 65 09/25/2021   Lab Results  Component Value Date   TRIG 75.0 09/25/2021   Lab Results  Component Value Date   CHOLHDL 3 09/25/2021   Lab Results  Component Value Date   HGBA1C 5.2 03/07/2021      Assessment & Plan:   Problem List Items Addressed This Visit       Digestive   Alcoholic cirrhosis of liver with ascites ([place])    No orders of the defined types were placed in this encounter.   Follow-up: No follow-ups on file.    Eugenia Pancoast,  FNP

## 2022-01-09 NOTE — Patient Instructions (Addendum)
------------------------------------    First hepatitis B vaccination was given in office today.  Second will be in five weeks and you will be completed with the hepatitis B series.   ------------------------------------  Call back [facility] to set up appointment again to get your endoscopy scheduled.  ------------------------------------  Your imaging for repeat CT scan of the lungs  Has been scheduled at the following location: PLEASE CALL TO SCHEDULE THIS APPOINTMENT AS IT WAS DUE IN OCTOBER 2023 AND IS OVERDUE.   [place]:  [facility] 2 locations:  [place], [place] Phone [phone],  8-430 pm   ------------------------------------   Milana Kidney FNP-C

## 2022-01-09 NOTE — Telephone Encounter (Signed)
Attempt to call to re-schedule [facility] LDCT for nodule follow up.  Patient was a no show for last appt in Nov 2023.  Left VM and call back number

## 2022-02-16 ENCOUNTER — Telehealth: Payer: Self-pay

## 2022-02-16 NOTE — Telephone Encounter (Signed)
I have sent message to her in teams to have her review and let us know. Holding for her response.

## 2022-02-16 NOTE — Telephone Encounter (Addendum)
Per nurse visit appts pt is already scheduled on 02/17/22 for pt to have 5 wk FU from office visit on 01/09/2022 for 2nd hep B vaccination that would  complete the series. From the 01/09/22 note you noted first hep B vaccine given in office but cannot find documentation of hep B vaccine being given, not on immunization list, no immunization order sheet found scanned in and Joellen cma checked the [place] registry and no documentation there. Joellen said to send note to you to see how to proceed. Thank you.The Immunization form is in Tabitha's in box for signing.

## 2022-02-16 NOTE — Telephone Encounter (Signed)
Per chart and lack of documentation it would appear patient did not receive this immunization at recent [place]. All efforts are made to document immunizations on the day they are given, which would indicate this was not completed.

## 2022-02-16 NOTE — Telephone Encounter (Signed)
If there a way to see who was my MA that day and verify with them? I seem to remember it was [facility] and I believe she gave him the True something series?

## 2022-02-17 ENCOUNTER — Ambulatory Visit (INDEPENDENT_AMBULATORY_CARE_PROVIDER_SITE_OTHER): Payer: PPO

## 2022-02-17 DIAGNOSIS — Z23 Encounter for immunization: Secondary | ICD-10-CM | POA: Diagnosis not present

## 2022-02-17 NOTE — Progress Notes (Signed)
Per orders of Tabitha. Dugal FNP, injection of Twinrix 1 ml given in right deltoid at pts request given by Ozzie Hoyle. Patient tolerated injection well.

## 2022-02-18 NOTE — Telephone Encounter (Signed)
Confirmed this was given at office visit, per pt and my recollection as well. Joellen can we please update thsi to reflect if you have not already that we gave first dose of twinrix 1/5?

## 2022-02-18 NOTE — Telephone Encounter (Signed)
Chart has been updated. No further action needed.

## 2022-03-18 ENCOUNTER — Other Ambulatory Visit: Payer: Self-pay | Admitting: Nurse Practitioner

## 2022-03-18 DIAGNOSIS — F1010 Alcohol abuse, uncomplicated: Secondary | ICD-10-CM | POA: Diagnosis not present

## 2022-03-18 DIAGNOSIS — R748 Abnormal levels of other serum enzymes: Secondary | ICD-10-CM | POA: Diagnosis not present

## 2022-03-18 DIAGNOSIS — K7031 Alcoholic cirrhosis of liver with ascites: Secondary | ICD-10-CM

## 2022-03-18 DIAGNOSIS — K766 Portal hypertension: Secondary | ICD-10-CM | POA: Diagnosis not present

## 2022-04-22 ENCOUNTER — Other Ambulatory Visit: Payer: Self-pay | Admitting: Family

## 2022-04-22 DIAGNOSIS — I10 Essential (primary) hypertension: Secondary | ICD-10-CM

## 2022-05-27 ENCOUNTER — Other Ambulatory Visit: Payer: Self-pay | Admitting: Primary Care

## 2022-05-27 DIAGNOSIS — J439 Emphysema, unspecified: Secondary | ICD-10-CM

## 2022-07-14 ENCOUNTER — Ambulatory Visit: Payer: PPO

## 2022-07-14 NOTE — Progress Notes (Deleted)
Per orders of Mayra Reel, DPN AGNP-C, injection of Twinrix #3 given by Donnamarie Poag in {Left/right:33004} deltoid. Patient tolerated injection well. VIS was sent to patient via MyChart. Updated [facility]. Patient will call if any questions.

## 2022-07-27 ENCOUNTER — Telehealth: Payer: Self-pay

## 2023-11-08 IMAGING — CT CT CHEST LUNG CANCER SCREENING LOW DOSE W/O CM
1 series · 15 of 33 positions shown, 19 images · non-contrast
Comparison: None.

CLINICAL DATA: Current smoker with 34 pack-year history



[Series 2: ldct screening <30 bmi · axial · 0.85mm/px · z∈[-426,-96]mm · 15 of 78 slices shown, 19 images]
[im 6/78  mediastinal]
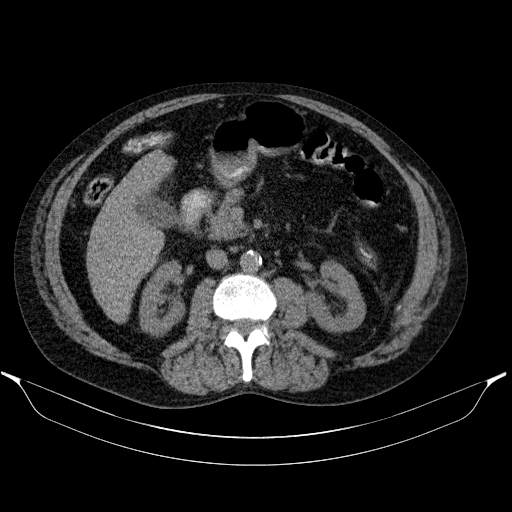
[im 6/78  lung]
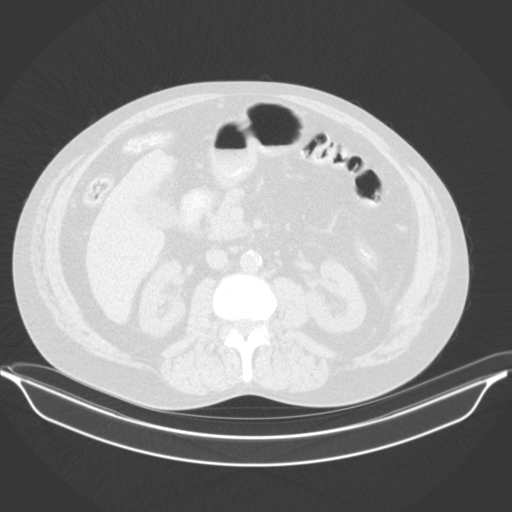
[im 12/78  lung]
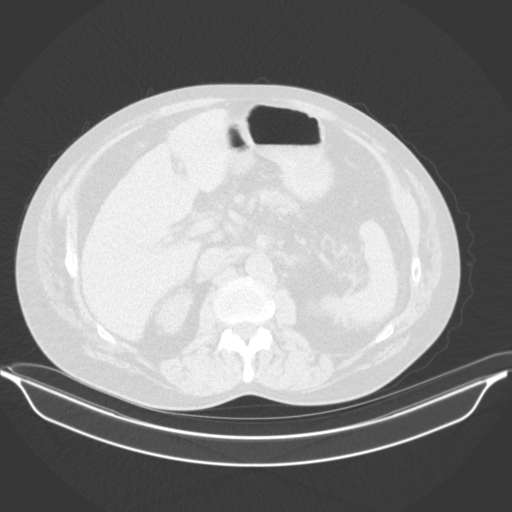
[im 16/78  lung]
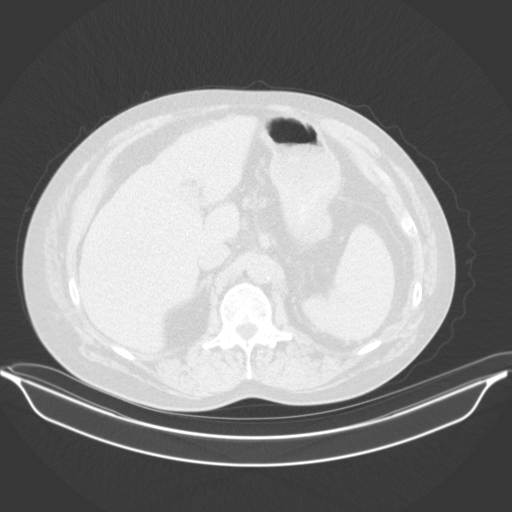
[im 20/78  lung]
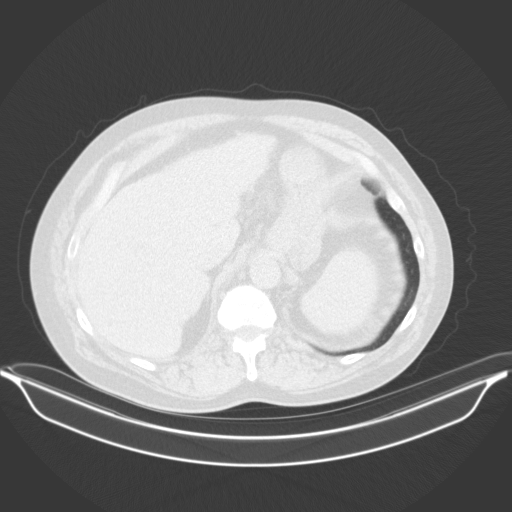
[im 26/78  mediastinal]
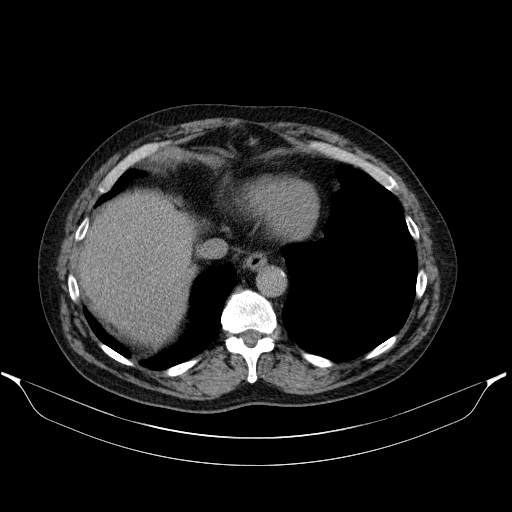
[im 26/78  lung]
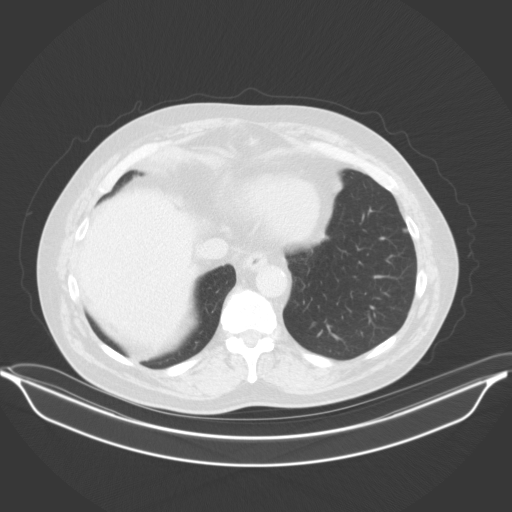
[im 31/78  lung]
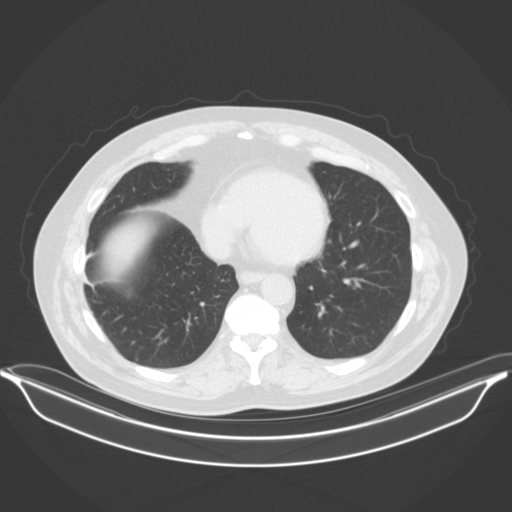
[im 35/78  lung]
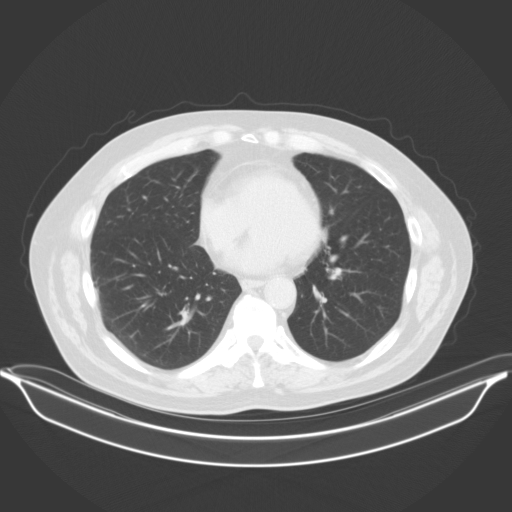
[im 40/78  lung]
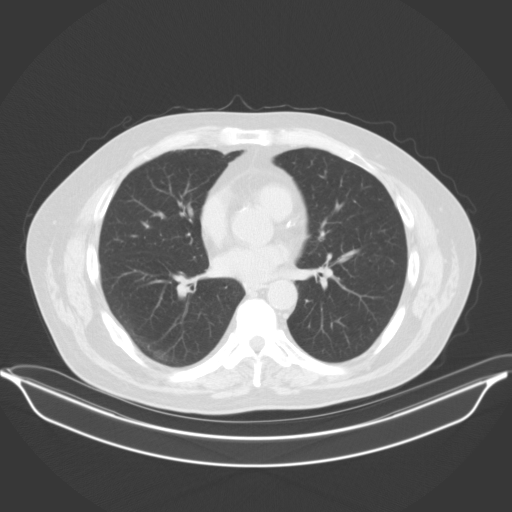
[im 43/78  mediastinal]
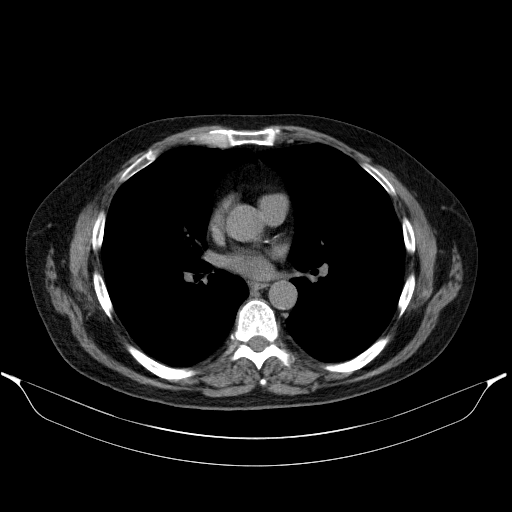
[im 43/78  lung]
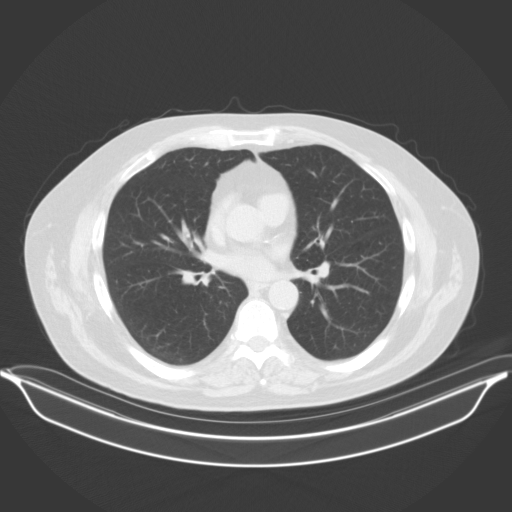
[im 47/78  lung]
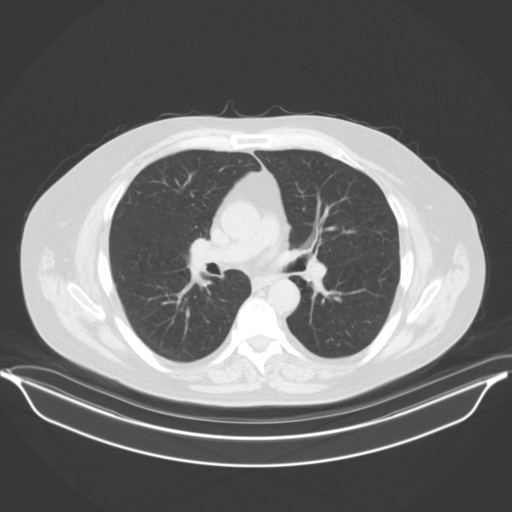
[im 52/78  lung]
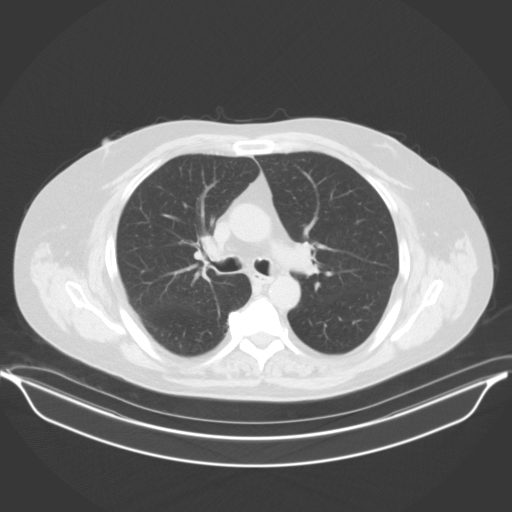
[im 58/78  lung]
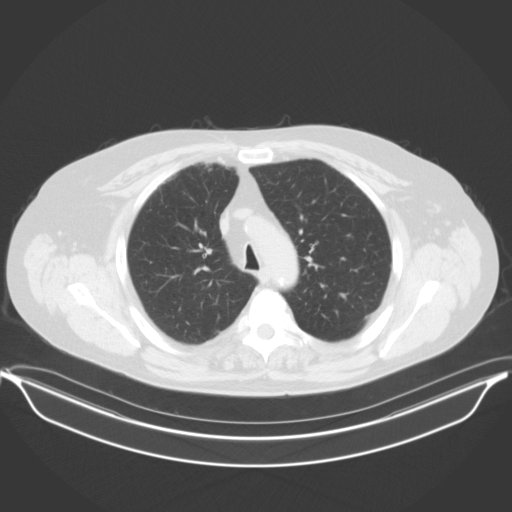
[im 62/78  mediastinal]
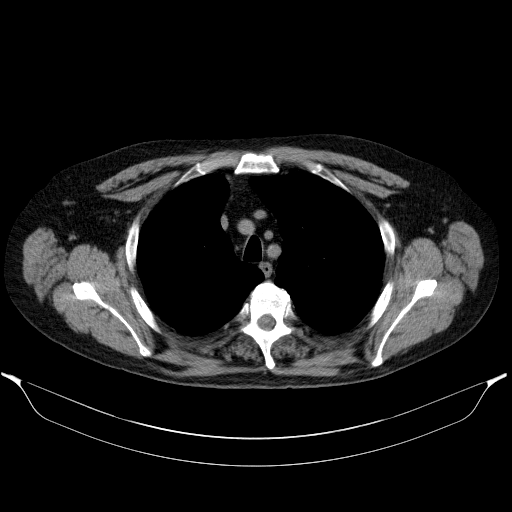
[im 62/78  lung]
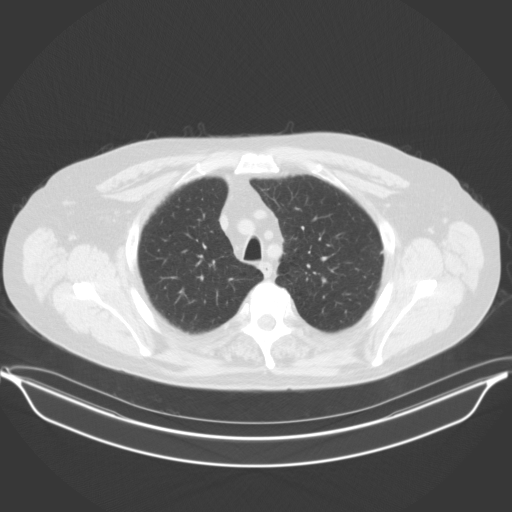
[im 66/78  lung]
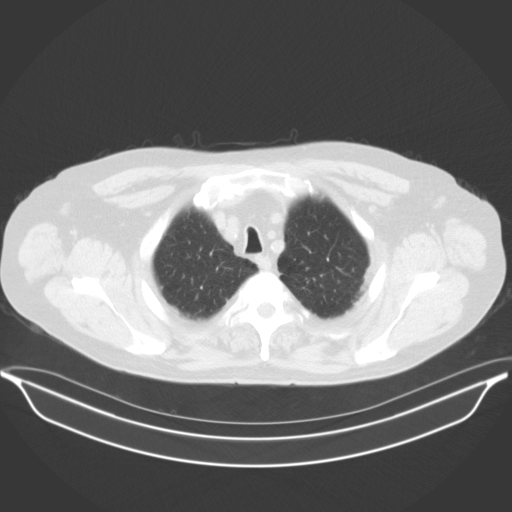
[im 72/78  lung]
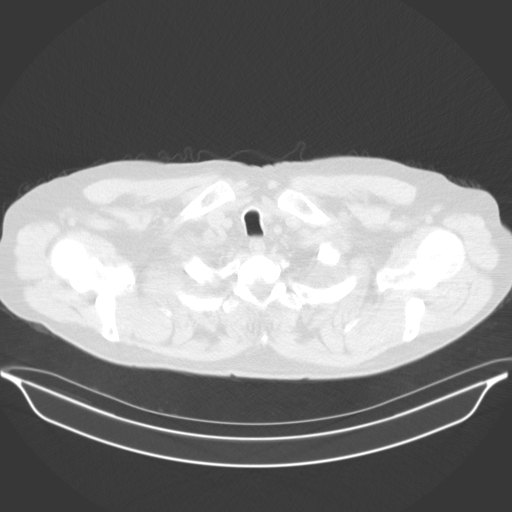

[15 of 33 positions shown; findings below may reference images not displayed]

FINDINGS: Cardiovascular: Normal heart size. No pericardial effusion. Left
main and three-vessel coronary artery calcifications.
Atherosclerotic disease of the thoracic aorta.

Mediastinum/Nodes: Small hiatal hernia. Thyroid is unremarkable. No
pathologically enlarged lymph nodes seen in the chest.

Lungs/Pleura: Central airways are patent. Mild paraseptal emphysema
all and biapical pleuroparenchymal scarring. No consolidation,
pleural effusion or pneumothorax. Small solid pulmonary nodules.
Largest is located in the left lower lobe and measures 6.2 mm in
mean diameter on image 263.

Upper Abdomen: Nodular liver contour.  No acute abnormality.

Musculoskeletal: No chest wall mass or suspicious bone lesions
identified.
IMPRESSION: 1. Lung-RADS 3, probably benign findings. Short-term follow-up in 6
months is recommended with repeat low-dose chest CT without contrast
(please use the following order, CT CHEST [facility]CS NODULE FOLLOW-UP W/O
CM).
2. Nodular liver contour, findings are suggestive of cirrhosis.
3. Left main and three-vessel coronary artery calcifications,
recommend ASCVD risk assessment.
4. Aortic Atherosclerosis (KTYO2-TDT.T) and Emphysema (KTYO2-3S8.7).
